# Patient Record
Sex: Male | Born: 1960 | Race: White | Hispanic: No | Marital: Married | Smoking: Former smoker
Health system: Southern US, Community
[De-identification: ages and names within clinical notes are randomized; demographics above are authoritative.]

## PROBLEM LIST (undated history)

## (undated) DIAGNOSIS — U071 COVID-19: Secondary | ICD-10-CM

## (undated) DIAGNOSIS — I059 Rheumatic mitral valve disease, unspecified: Secondary | ICD-10-CM

## (undated) DIAGNOSIS — E785 Hyperlipidemia, unspecified: Secondary | ICD-10-CM

## (undated) DIAGNOSIS — T148XXA Other injury of unspecified body region, initial encounter: Secondary | ICD-10-CM

## (undated) DIAGNOSIS — E119 Type 2 diabetes mellitus without complications: Secondary | ICD-10-CM

## (undated) DIAGNOSIS — M25512 Pain in left shoulder: Secondary | ICD-10-CM

## (undated) DIAGNOSIS — D126 Benign neoplasm of colon, unspecified: Secondary | ICD-10-CM

## (undated) DIAGNOSIS — M1990 Unspecified osteoarthritis, unspecified site: Secondary | ICD-10-CM

## (undated) DIAGNOSIS — F1011 Alcohol abuse, in remission: Secondary | ICD-10-CM

## (undated) DIAGNOSIS — R200 Anesthesia of skin: Secondary | ICD-10-CM

## (undated) DIAGNOSIS — E7800 Pure hypercholesterolemia, unspecified: Secondary | ICD-10-CM

## (undated) DIAGNOSIS — I700 Atherosclerosis of aorta: Secondary | ICD-10-CM

## (undated) DIAGNOSIS — K8020 Calculus of gallbladder without cholecystitis without obstruction: Secondary | ICD-10-CM

## (undated) DIAGNOSIS — K7460 Unspecified cirrhosis of liver: Secondary | ICD-10-CM

## (undated) DIAGNOSIS — I82409 Acute embolism and thrombosis of unspecified deep veins of unspecified lower extremity: Secondary | ICD-10-CM

## (undated) HISTORY — DX: Calculus of gallbladder without cholecystitis without obstruction: K80.20

## (undated) HISTORY — DX: Acute embolism and thrombosis of unspecified deep veins of unspecified lower extremity: I82.409

## (undated) HISTORY — DX: Alcohol abuse, in remission: F10.11

## (undated) HISTORY — PX: HAND SURGERY: SHX662

## (undated) HISTORY — DX: Hyperlipidemia, unspecified: E78.5

## (undated) HISTORY — DX: Pain in left shoulder: M25.512

## (undated) HISTORY — DX: Other injury of unspecified body region, initial encounter: T14.8XXA

## (undated) HISTORY — PX: LEG SURGERY: SHX1003

## (undated) HISTORY — DX: COVID-19: U07.1

## (undated) HISTORY — PX: EYE SURGERY: SHX253

## (undated) HISTORY — DX: Unspecified osteoarthritis, unspecified site: M19.90

## (undated) HISTORY — DX: Unspecified cirrhosis of liver: K74.60

## (undated) HISTORY — DX: Benign neoplasm of colon, unspecified: D12.6

## (undated) HISTORY — DX: Anesthesia of skin: R20.0

## (undated) HISTORY — DX: Rheumatic mitral valve disease, unspecified: I05.9

## (undated) HISTORY — DX: Atherosclerosis of aorta: I70.0

---

## 2015-01-04 ENCOUNTER — Emergency Department (HOSPITAL_COMMUNITY): Payer: Managed Care, Other (non HMO)

## 2015-01-04 ENCOUNTER — Encounter (HOSPITAL_COMMUNITY): Payer: Self-pay | Admitting: *Deleted

## 2015-01-04 ENCOUNTER — Emergency Department (HOSPITAL_COMMUNITY)
Admission: EM | Admit: 2015-01-04 | Discharge: 2015-01-04 | Disposition: A | Discharge status: Home / Self Care | Payer: Managed Care, Other (non HMO) | Attending: Emergency Medicine | Admitting: Emergency Medicine

## 2015-01-04 DIAGNOSIS — S0990XA Unspecified injury of head, initial encounter: Secondary | ICD-10-CM | POA: Insufficient documentation

## 2015-01-04 DIAGNOSIS — T1490XA Injury, unspecified, initial encounter: Secondary | ICD-10-CM

## 2015-01-04 DIAGNOSIS — S30811A Abrasion of abdominal wall, initial encounter: Secondary | ICD-10-CM | POA: Insufficient documentation

## 2015-01-04 DIAGNOSIS — S0285XB Fracture of orbit, unspecified, initial encounter for open fracture: Secondary | ICD-10-CM

## 2015-01-04 DIAGNOSIS — Y9289 Other specified places as the place of occurrence of the external cause: Secondary | ICD-10-CM | POA: Insufficient documentation

## 2015-01-04 DIAGNOSIS — W1789XA Other fall from one level to another, initial encounter: Secondary | ICD-10-CM | POA: Insufficient documentation

## 2015-01-04 DIAGNOSIS — S299XXA Unspecified injury of thorax, initial encounter: Secondary | ICD-10-CM | POA: Insufficient documentation

## 2015-01-04 DIAGNOSIS — S01112A Laceration without foreign body of left eyelid and periocular area, initial encounter: Secondary | ICD-10-CM | POA: Insufficient documentation

## 2015-01-04 DIAGNOSIS — Y9389 Activity, other specified: Secondary | ICD-10-CM | POA: Insufficient documentation

## 2015-01-04 DIAGNOSIS — Y998 Other external cause status: Secondary | ICD-10-CM | POA: Insufficient documentation

## 2015-01-04 DIAGNOSIS — S81812A Laceration without foreign body, left lower leg, initial encounter: Secondary | ICD-10-CM | POA: Diagnosis not present

## 2015-01-04 DIAGNOSIS — S0280XA Fracture of other specified skull and facial bones, unspecified side, initial encounter for closed fracture: Secondary | ICD-10-CM | POA: Insufficient documentation

## 2015-01-04 DIAGNOSIS — S0993XA Unspecified injury of face, initial encounter: Secondary | ICD-10-CM | POA: Diagnosis present

## 2015-01-04 DIAGNOSIS — Z79899 Other long term (current) drug therapy: Secondary | ICD-10-CM | POA: Diagnosis not present

## 2015-01-04 DIAGNOSIS — Z23 Encounter for immunization: Secondary | ICD-10-CM | POA: Insufficient documentation

## 2015-01-04 DIAGNOSIS — S0181XA Laceration without foreign body of other part of head, initial encounter: Secondary | ICD-10-CM

## 2015-01-04 DIAGNOSIS — E7800 Pure hypercholesterolemia, unspecified: Secondary | ICD-10-CM | POA: Insufficient documentation

## 2015-01-04 HISTORY — DX: Pure hypercholesterolemia, unspecified: E78.00

## 2015-01-04 LAB — COMPREHENSIVE METABOLIC PANEL
ALBUMIN: 3.6 g/dL (ref 3.5–5.0)
ALK PHOS: 120 U/L (ref 38–126)
ALT: 144 U/L — AB (ref 17–63)
AST: 157 U/L — AB (ref 15–41)
Anion gap: 10 (ref 5–15)
BUN: 18 mg/dL (ref 6–20)
CALCIUM: 8.8 mg/dL — AB (ref 8.9–10.3)
CHLORIDE: 100 mmol/L — AB (ref 101–111)
CO2: 27 mmol/L (ref 22–32)
CREATININE: 1.02 mg/dL (ref 0.61–1.24)
GFR calc Af Amer: >60 mL/min (ref >60)
GFR calc non Af Amer: >60 mL/min (ref >60)
GLUCOSE: 141 mg/dL — AB (ref 65–99)
Potassium: 3.7 mmol/L (ref 3.5–5.1)
SODIUM: 137 mmol/L (ref 135–145)
Total Bilirubin: 0.5 mg/dL (ref 0.3–1.2)
Total Protein: 6.8 g/dL (ref 6.5–8.1)

## 2015-01-04 LAB — CBC WITH DIFFERENTIAL/PLATELET
BASOS ABS: 0 10*3/uL (ref 0.0–0.1)
Basophils Relative: 0 %
EOS PCT: 2 %
Eosinophils Absolute: 0.1 10*3/uL (ref 0.0–0.7)
HCT: 46.4 % (ref 39.0–52.0)
Hemoglobin: 15.9 g/dL (ref 13.0–17.0)
LYMPHS PCT: 20 %
Lymphs Abs: 1.9 10*3/uL (ref 0.7–4.0)
MCH: 33.5 pg (ref 26.0–34.0)
MCHC: 34.3 g/dL (ref 30.0–36.0)
MCV: 97.9 fL (ref 78.0–100.0)
MONO ABS: 0.6 10*3/uL (ref 0.1–1.0)
MONOS PCT: 6 %
Neutro Abs: 7 10*3/uL (ref 1.7–7.7)
Neutrophils Relative %: 72 %
PLATELETS: 151 10*3/uL (ref 150–400)
RBC: 4.74 MIL/uL (ref 4.22–5.81)
RDW: 13 % (ref 11.5–15.5)
WBC: 9.6 10*3/uL (ref 4.0–10.5)

## 2015-01-04 LAB — I-STAT CHEM 8, ED
BUN: 22 mg/dL — AB (ref 6–20)
Calcium, Ion: 1.1 mmol/L — ABNORMAL LOW (ref 1.12–1.23)
Chloride: 101 mmol/L (ref 101–111)
Creatinine, Ser: 1 mg/dL (ref 0.61–1.24)
GLUCOSE: 138 mg/dL — AB (ref 65–99)
HCT: 47 % (ref 39.0–52.0)
HEMOGLOBIN: 16 g/dL (ref 13.0–17.0)
POTASSIUM: 3.7 mmol/L (ref 3.5–5.1)
Sodium: 143 mmol/L (ref 135–145)
TCO2: 27 mmol/L (ref 0–100)

## 2015-01-04 LAB — PROTIME-INR
INR: 1.17 (ref 0.00–1.49)
Prothrombin Time: 15 seconds (ref 11.6–15.2)

## 2015-01-04 LAB — TYPE AND SCREEN
ABO/RH(D): O POS
Antibody Screen: POSITIVE
DAT, IgG: NEGATIVE
PT AG Type: NEGATIVE

## 2015-01-04 MED ORDER — CEFAZOLIN SODIUM 1-5 GM-% IV SOLN
1.0000 g | Freq: Once | INTRAVENOUS | Status: AC
  Administered 2015-01-04: 1 g via INTRAVENOUS
  Filled 2015-01-04: qty 50

## 2015-01-04 MED ORDER — IOHEXOL 300 MG/ML  SOLN
100.0000 mL | Freq: Once | INTRAMUSCULAR | Status: AC | PRN
  Administered 2015-01-04: 100 mL via INTRAVENOUS

## 2015-01-04 MED ORDER — ERYTHROMYCIN 5 MG/GM OP OINT
1.0000 "application " | TOPICAL_OINTMENT | Freq: Once | OPHTHALMIC | Status: AC
  Administered 2015-01-04: 1 via OPHTHALMIC
  Filled 2015-01-04: qty 3.5

## 2015-01-04 MED ORDER — BACITRACIN ZINC 500 UNIT/GM EX OINT: 1.0000 "application " | TOPICAL_OINTMENT | Freq: Two times a day (BID) | CUTANEOUS | Status: DC

## 2015-01-04 MED ORDER — TETANUS-DIPHTH-ACELL PERTUSSIS 5-2.5-18.5 LF-MCG/0.5 IM SUSP
0.5000 mL | Freq: Once | INTRAMUSCULAR | Status: AC
  Administered 2015-01-04: 0.5 mL via INTRAMUSCULAR
  Filled 2015-01-04: qty 0.5

## 2015-01-04 MED ORDER — LIDOCAINE-EPINEPHRINE 1 %-1:100000 IJ SOLN
10.0000 mL | Freq: Once | INTRAMUSCULAR | Status: AC
  Administered 2015-01-04: 1 mL
  Filled 2015-01-04: qty 1

## 2015-01-04 MED ORDER — HYDROCODONE-ACETAMINOPHEN 5-325 MG PO TABS: 1.0000 | ORAL_TABLET | Freq: Four times a day (QID) | ORAL | Status: DC | PRN

## 2015-01-04 MED ORDER — CEPHALEXIN 500 MG PO CAPS: 500.0000 mg | ORAL_CAPSULE | Freq: Four times a day (QID) | ORAL | Status: DC

## 2015-01-04 MED ORDER — SODIUM CHLORIDE 0.9 % IV SOLN
Freq: Once | INTRAVENOUS | Status: AC
  Administered 2015-01-04: 15:00:00 via INTRAVENOUS

## 2015-01-04 MED ORDER — SODIUM CHLORIDE 0.9 % IV SOLN
INTRAVENOUS | Status: AC | PRN
  Administered 2015-01-04: 1000 mL via INTRAVENOUS

## 2015-01-04 MED ORDER — FENTANYL CITRATE (PF) 100 MCG/2ML IJ SOLN
100.0000 ug | Freq: Once | INTRAMUSCULAR | Status: AC
  Administered 2015-01-04: 100 ug via INTRAVENOUS
  Filled 2015-01-04: qty 2

## 2015-01-04 MED ORDER — ERYTHROMYCIN 5 MG/GM OP OINT: 1.0000 "application " | TOPICAL_OINTMENT | Freq: Once | OPHTHALMIC | Status: DC

## 2015-01-04 MED ORDER — FENTANYL CITRATE (PF) 100 MCG/2ML IJ SOLN
50.0000 ug | Freq: Once | INTRAMUSCULAR | Status: AC
  Administered 2015-01-04: 50 ug via INTRAVENOUS
  Filled 2015-01-04: qty 2

## 2015-01-04 NOTE — Progress Notes (Signed)
IS 1300-1500. Good cooperation. No complications noted. RN aware/bedside.

## 2015-01-04 NOTE — ED Notes (Signed)
Dr. Alfonse Spruce aware of pt.s low BP, Pt. Just received fentanyl

## 2015-01-04 NOTE — ED Provider Notes (Signed)
CSN: [id number]     Arrival date & time 01/04/15  1328 History   First MD Initiated Contact with Patient 01/04/15 1337     Chief Complaint  Patient presents with  . Trauma     (Consider location/radiation/quality/duration/timing/severity/associated sxs/prior Treatment) HPI Comments: 54 y.o. Male presents as a trauma.  The patient was about 12-15 feet off of the ground in a cherry picker/elevated stand when he fell.  The patient denies loosing consciousness but reports pain in his chest ever since the fall.  The patient denies being on blood thinners.  He states that he did land face down on the ground when he fell.     Past Medical History  Diagnosis Date  . High cholesterol    History reviewed. No pertinent past surgical history. History reviewed. No pertinent family history. Social History  Substance Use Topics  . Smoking status: Unknown If Ever Smoked  . Smokeless tobacco: None  . Alcohol Use: None    Review of Systems  Constitutional: Negative for fever, chills, appetite change and fatigue.  HENT: Negative for congestion, nosebleeds and sore throat.   Eyes: Positive for pain. Negative for photophobia and visual disturbance.  Respiratory: Negative for cough, chest tightness, shortness of breath and wheezing.   Cardiovascular: Positive for chest pain.  Gastrointestinal: Negative for nausea, vomiting, abdominal pain, diarrhea and constipation.  Genitourinary: Negative for dysuria, urgency and hematuria.  Musculoskeletal: Negative for myalgias and back pain.  Skin: Negative for rash.  Neurological: Negative for dizziness, seizures, syncope, speech difficulty, weakness and headaches.  Hematological: Does not bruise/bleed easily.      Allergies  Review of patient's allergies indicates no known allergies.  Home Medications   Prior to Admission medications   Medication Sig Start Date End Date Taking? Authorizing Provider  atorvastatin (LIPITOR) 40 MG tablet Take 40 mg by  mouth daily.   Yes Historical Provider, MD   BP 99/64 mmHg  Pulse 88  Temp(Src) 97.7 F (36.5 C) (Oral)  Resp 14  SpO2 100% Physical Exam  Constitutional: He is oriented to person, place, and time. No distress.  HENT:  Head: Normocephalic. Head is with contusion (significant of the upper eyelid) and with laceration (two lacerations around the left eye at sites depicted in the diagram).    Right Ear: Tympanic membrane normal. No hemotympanum.  Left Ear: Tympanic membrane normal. No hemotympanum.  Nose: No nasal septal hematoma. No epistaxis.  Mouth/Throat: Mucous membranes are not dry. No lacerations. No oropharyngeal exudate or posterior oropharyngeal edema.  Eyes: Pupils are equal, round, and reactive to light. Right eye exhibits no chemosis and no exudate. Left eye exhibits chemosis. Left eye exhibits no discharge and no exudate. No foreign body present in the left eye. Left conjunctiva is injected (with chemosis). Right eye exhibits normal extraocular motion and no nystagmus. Left eye exhibits normal extraocular motion and no nystagmus.  Fundoscopic exam:      The right eye shows no hemorrhage.       The left eye shows no hemorrhage.  Slit lamp exam:      The right eye shows no hyphema.       The left eye shows no hyphema.  Significant swelling of the eyelids of the left eye.  When upper lid retracted pupil round and reactive.  Full visual fields to confrontation in both eyes.  Full EOM in the left eye without pain or difficulty.  Patient able to count fingers when left eye held open but visual acuity  limited secondary to lid swelling  Neck: No spinous process tenderness and no muscular tenderness present.  Pulmonary/Chest: No respiratory distress. He has wheezes (few scattered bilaterally). He has no rales. He exhibits tenderness (diffuse chest wall tenderness). He exhibits no crepitus, no edema and no retraction.  Abdominal: Soft. He exhibits no distension. There is no tenderness.    Abrasion over the anterior upper abdominal wall  Musculoskeletal: Normal range of motion. He exhibits no edema or tenderness.  Neurological: He is alert and oriented to person, place, and time. He has normal strength. No cranial nerve deficit or sensory deficit. He exhibits normal muscle tone.  Patient able to ambulate to the bathroom without difficulty  Skin: Laceration (laceration over the left shin at the site depicted in the diagram) noted. He is not diaphoretic.          ED Course  Procedures (including critical care time) LACERATION REPAIR Performed by: Earlie Server Authorized by: Earlie Server Consent: Verbal consent obtained. Risks and benefits: risks, benefits and alternatives were discussed Consent given by: patient Patient identity confirmed: provided demographic data Prepped and Draped in normal sterile fashion Wound explored  Laceration Location: face around left eye, left shin  Laceration Length: Total: 4 cm  No Foreign Bodies seen or palpated  Anesthesia: local infiltration  Local anesthetic: lidocaine 1% with epinephrine  Anesthetic total: 10 ml  Irrigation method: syringe Amount of cleaning: standard  Skin closure: eyelid laceration repairs with subcutaneous and simple interrupted, left shin laceration closed with simple interrupted and horizontal and vertical mattress sutures  Number of sutures: 8 subcutaneous, 15 simple, 3 mattress sutures  Technique: subcutaneous, simple interrupted, and mattress sutures  Patient tolerance: Patient tolerated the procedure well with no immediate complications.  Labs Review Labs Reviewed  COMPREHENSIVE METABOLIC PANEL - Abnormal; Notable for the following:    Chloride 100 (*)    Glucose, Bld 141 (*)    Calcium 8.8 (*)    AST 157 (*)    ALT 144 (*)    All other components within normal limits  I-STAT CHEM 8, ED - Abnormal; Notable for the following:    BUN 22 (*)    Glucose, Bld 138 (*)    Calcium, Ion  1.10 (*)    All other components within normal limits  CBC WITH DIFFERENTIAL/PLATELET  PROTIME-INR  URINALYSIS, ROUTINE W REFLEX MICROSCOPIC (NOT AT [facility])  TYPE AND SCREEN    Imaging Review Dg Tibia/fibula Left  01/04/2015  CLINICAL DATA:  Pt was in cherry picker and the tree he was cutting down fell and knocked him down, landing on his face. He has sharp left sided chest pain. Ne pelvis complaints. And a deep penetrated laceration on the lateral side of his calf EXAM: LEFT TIBIA AND FIBULA - 2 VIEW COMPARISON:  None. FINDINGS: There is no evidence of fracture or other focal bone lesions. The knee and lateral malleolus are incompletely visualized on the lateral projection. Subcutaneous gas noted at the anterolateral mid calf level. No radiodense foreign body. IMPRESSION: Negative. Electronically Signed   By: Lucrezia Europe M.D.   On: 01/04/2015 14:12   Ct Head Wo Contrast  01/04/2015  CLINICAL DATA:  Multiple trauma to the head and face after being knocked from an elevated bucket by a tree. EXAM: CT HEAD WITHOUT CONTRAST CT MAXILLOFACIAL WITHOUT CONTRAST CT CERVICAL SPINE WITHOUT CONTRAST TECHNIQUE: Multidetector CT imaging of the head, cervical spine, and maxillofacial structures were performed using the standard protocol without intravenous contrast.  Multiplanar CT image reconstructions of the cervical spine and maxillofacial structures were also generated. COMPARISON:  None. FINDINGS: CT HEAD FINDINGS There is no acute intracranial hemorrhage, acute infarction, or intracranial mass lesion. Brain parenchyma is normal. Ventricles are normal. There is an air-fluid level in the left maxillary sinus and there is hemorrhage in the left orbit including intraconal and extraconal. CT MAXILLOFACIAL FINDINGS There is a blowout fracture of the floor of the left orbit. This does not involve the inferior orbital rim. There is a small air-fluid level in the left maxillary sinus. There is hemorrhage around the left by  a including intra and extraconal. There is no muscle entrapment in the fracture. The other walls of the orbit are intact. CT CERVICAL SPINE FINDINGS There is no fracture, subluxation, disc space narrowing, facet arthritis, or prevertebral soft tissue swelling. IMPRESSION: 1. Blowout fracture of the floor of the left orbit without involvement of the inferior orbital rim. There is intraconal and extraconal hemorrhage in the left orbit. 2. Normal appearing brain. 3. Normal appearing cervical spine. Electronically Signed   By: Lorriane Shire M.D.   On: 01/04/2015 14:58   Ct Chest W Contrast  01/04/2015  CLINICAL DATA:  Fall 10-12 feet, chest pain EXAM: CT CHEST, ABDOMEN, AND PELVIS WITH CONTRAST TECHNIQUE: Multidetector CT imaging of the chest, abdomen and pelvis was performed following the standard protocol during bolus administration of intravenous contrast. CONTRAST:  100 mL Omnipaque 300 IV COMPARISON:  None. FINDINGS: CT CHEST FINDINGS Mediastinum/Nodes: Heart is normal in size. No pericardial effusion. No suspicious mediastinal, hilar, or axillary lymphadenopathy. Visualized thyroid is unremarkable. Lungs/Pleura: Evaluation lung parenchyma is constrained by respiratory motion. Lungs are essentially clear. No suspicious pulmonary nodules. No focal consolidation. No pleural effusion or pneumothorax. Musculoskeletal: Degenerative changes of the thoracic spine. No fracture is seen. CT ABDOMEN PELVIS FINDINGS Hepatobiliary: Liver is notable for a nodular hepatic contour. No suspicious/enhancing hepatic lesions. Layering small gallstones (series 3/image 55). No associated inflammatory changes. No intrahepatic or extrahepatic ductal dilatation. Pancreas: Within normal limits. Spleen: Within normal limits. Adrenals/Urinary Tract: Adrenal glands are within normal limits. 7 mm posterior left lower pole renal cyst (series 3/ image 81). Kidneys are otherwise within normal limits. No hydronephrosis. Bladder is within  normal limits. Stomach/Bowel: Stomach is within normal limits. No evidence of bowel obstruction. Normal appendix (series 3/ image 98). Vascular/Lymphatic: Atherosclerotic calcifications of the abdominal aorta and branch vessels. No suspicious abdominopelvic lymphadenopathy. Reproductive: Prostate is unremarkable. Other: No abdominopelvic ascites. No hemoperitoneum or free air. Musculoskeletal: Mild superior endplate Schmorl's node deformity at L3 (sagittal image 78), chronic. Degenerative changes of the lumbar spine. No fracture is seen. IMPRESSION: No evidence of traumatic injury to the chest, abdomen, or pelvis. Nodular hepatic contour, raising the possibility of cirrhosis. Electronically Signed   By: Julian Hy M.D.   On: 01/04/2015 15:02   Ct Cervical Spine Wo Contrast  01/04/2015  CLINICAL DATA:  Multiple trauma to the head and face after being knocked from an elevated bucket by a tree. EXAM: CT HEAD WITHOUT CONTRAST CT MAXILLOFACIAL WITHOUT CONTRAST CT CERVICAL SPINE WITHOUT CONTRAST TECHNIQUE: Multidetector CT imaging of the head, cervical spine, and maxillofacial structures were performed using the standard protocol without intravenous contrast. Multiplanar CT image reconstructions of the cervical spine and maxillofacial structures were also generated. COMPARISON:  None. FINDINGS: CT HEAD FINDINGS There is no acute intracranial hemorrhage, acute infarction, or intracranial mass lesion. Brain parenchyma is normal. Ventricles are normal. There is an air-fluid level in the  left maxillary sinus and there is hemorrhage in the left orbit including intraconal and extraconal. CT MAXILLOFACIAL FINDINGS There is a blowout fracture of the floor of the left orbit. This does not involve the inferior orbital rim. There is a small air-fluid level in the left maxillary sinus. There is hemorrhage around the left by a including intra and extraconal. There is no muscle entrapment in the fracture. The other walls of  the orbit are intact. CT CERVICAL SPINE FINDINGS There is no fracture, subluxation, disc space narrowing, facet arthritis, or prevertebral soft tissue swelling. IMPRESSION: 1. Blowout fracture of the floor of the left orbit without involvement of the inferior orbital rim. There is intraconal and extraconal hemorrhage in the left orbit. 2. Normal appearing brain. 3. Normal appearing cervical spine. Electronically Signed   By: Lorriane Shire M.D.   On: 01/04/2015 14:58   Ct Abdomen Pelvis W Contrast  01/04/2015  CLINICAL DATA:  Fall 10-12 feet, chest pain EXAM: CT CHEST, ABDOMEN, AND PELVIS WITH CONTRAST TECHNIQUE: Multidetector CT imaging of the chest, abdomen and pelvis was performed following the standard protocol during bolus administration of intravenous contrast. CONTRAST:  100 mL Omnipaque 300 IV COMPARISON:  None. FINDINGS: CT CHEST FINDINGS Mediastinum/Nodes: Heart is normal in size. No pericardial effusion. No suspicious mediastinal, hilar, or axillary lymphadenopathy. Visualized thyroid is unremarkable. Lungs/Pleura: Evaluation lung parenchyma is constrained by respiratory motion. Lungs are essentially clear. No suspicious pulmonary nodules. No focal consolidation. No pleural effusion or pneumothorax. Musculoskeletal: Degenerative changes of the thoracic spine. No fracture is seen. CT ABDOMEN PELVIS FINDINGS Hepatobiliary: Liver is notable for a nodular hepatic contour. No suspicious/enhancing hepatic lesions. Layering small gallstones (series 3/image 55). No associated inflammatory changes. No intrahepatic or extrahepatic ductal dilatation. Pancreas: Within normal limits. Spleen: Within normal limits. Adrenals/Urinary Tract: Adrenal glands are within normal limits. 7 mm posterior left lower pole renal cyst (series 3/ image 81). Kidneys are otherwise within normal limits. No hydronephrosis. Bladder is within normal limits. Stomach/Bowel: Stomach is within normal limits. No evidence of bowel obstruction.  Normal appendix (series 3/ image 98). Vascular/Lymphatic: Atherosclerotic calcifications of the abdominal aorta and branch vessels. No suspicious abdominopelvic lymphadenopathy. Reproductive: Prostate is unremarkable. Other: No abdominopelvic ascites. No hemoperitoneum or free air. Musculoskeletal: Mild superior endplate Schmorl's node deformity at L3 (sagittal image 78), chronic. Degenerative changes of the lumbar spine. No fracture is seen. IMPRESSION: No evidence of traumatic injury to the chest, abdomen, or pelvis. Nodular hepatic contour, raising the possibility of cirrhosis. Electronically Signed   By: Julian Hy M.D.   On: 01/04/2015 15:02   Dg Pelvis Portable  01/04/2015  CLINICAL DATA:  Pt was in cherry picker and the tree he was cutting down fell and knocked him down, landing on his face. He has sharp left sided chest pain. Ne pelvis complaints. And a deep penetrated laceration on the lateral side of his calf * EXAM: PORTABLE PELVIS 1-2 VIEWS COMPARISON:  None. FINDINGS: There is no evidence of pelvic fracture or diastasis. No pelvic bone lesions are seen. IMPRESSION: Negative. Electronically Signed   By: Lucrezia Europe M.D.   On: 01/04/2015 14:11   Dg Chest Portable 1 View  01/04/2015  CLINICAL DATA:  Pt was in cherry picker and the tree he was cutting down fell and knocked him down, landing on his face. He has sharp left sided chest pain. Ne pelvis complaints. And a deep penetrated laceration on the lateral side of his calf EXAM: PORTABLE CHEST - 1 VIEW COMPARISON:  None available FINDINGS: Lungs are clear. Heart size and mediastinal contours are within normal limits. No effusion.  No pneumothorax. Visualized skeletal structures are unremarkable. IMPRESSION: No acute cardiopulmonary disease. Electronically Signed   By: Lucrezia Europe M.D.   On: 01/04/2015 14:10   Ct Maxillofacial Wo Cm  01/04/2015  CLINICAL DATA:  Multiple trauma to the head and face after being knocked from an elevated bucket  by a tree. EXAM: CT HEAD WITHOUT CONTRAST CT MAXILLOFACIAL WITHOUT CONTRAST CT CERVICAL SPINE WITHOUT CONTRAST TECHNIQUE: Multidetector CT imaging of the head, cervical spine, and maxillofacial structures were performed using the standard protocol without intravenous contrast. Multiplanar CT image reconstructions of the cervical spine and maxillofacial structures were also generated. COMPARISON:  None. FINDINGS: CT HEAD FINDINGS There is no acute intracranial hemorrhage, acute infarction, or intracranial mass lesion. Brain parenchyma is normal. Ventricles are normal. There is an air-fluid level in the left maxillary sinus and there is hemorrhage in the left orbit including intraconal and extraconal. CT MAXILLOFACIAL FINDINGS There is a blowout fracture of the floor of the left orbit. This does not involve the inferior orbital rim. There is a small air-fluid level in the left maxillary sinus. There is hemorrhage around the left by a including intra and extraconal. There is no muscle entrapment in the fracture. The other walls of the orbit are intact. CT CERVICAL SPINE FINDINGS There is no fracture, subluxation, disc space narrowing, facet arthritis, or prevertebral soft tissue swelling. IMPRESSION: 1. Blowout fracture of the floor of the left orbit without involvement of the inferior orbital rim. There is intraconal and extraconal hemorrhage in the left orbit. 2. Normal appearing brain. 3. Normal appearing cervical spine. Electronically Signed   By: Lorriane Shire M.D.   On: 01/04/2015 14:58   I have personally reviewed and evaluated these images and lab results as part of my medical decision-making.   EKG Interpretation None      MDM  Patent was seen and evaluated immediately in the trauma bay.  CT imaging with blow out fracture of the left orbit but no sign of globe injury and no sign of globe injury on examination and no signs of entrapment on CT or on physical examination.  CT negative for other acute  process.  C collar removed.  Lacerations around the eye discussed with Dr. Prudencio Burly who reviewed images of the lacerations and gave suggestions on repair but said they could be closed in the ER by myself.  Patient was given Kefzol for antibiotic coverage.  Lacerations closed as detailed above.  Patient given boostrix.  Fentanyl given for pain control.  Patient drank water without issue.  Ambulated to the bathroom well.  Patient given Erythromycin ointment for his left eye and Keflex.  He was also provided an incentive spirometer and Norco for pain control.  He was given strict return precautions and instruction to follow up Monday with ophtho and his PCP next week.  He had lots of support at home from family and friends.  He was discharged home in stable condition.  All questions answered prior to discharge. Final diagnoses:  None    1. Multiple lacerations  2. Facial injury  3. Orbital fracture    Harvel Quale, MD 01/05/15 [phone]

## 2015-01-04 NOTE — ED Notes (Signed)
Pt arrived by [facility], pt was in elevated bucket, had a tree fall down that knocked him down 10-76ft, he landed face first on ground. No loc. Has swelling to left eye, left side rib pain and diminished lung sounds on left side per ems. Abrasions noted to lower legs.

## 2015-01-04 NOTE — Discharge Instructions (Signed)
You were seen today after your fall.  You should rest at home.  Use the incentive spirometer to keep your lungs open and to keep from developing pneumonia.  Follow up with your primary care physician before returning to work for clearance.  Follow up with the eye doctor as directed.  Use the eye ointment provided 3-4 times a day.  Take the oral antibiotics and pain medication as prescribed.  Orbital Floor Fracture, Blowout The orbit, also called the eye socket, is a bony structure that protects the eye. The bottom wall of the orbit is called the orbital floor. It separates the orbit from a sinus. An orbital floor fracture is a break in the orbital floor. This type of fracture is called a "blowout" when tissues around the eye, including the muscle that is used to make the eye look down, becomes trapped within the fracture. CAUSES  An orbital floor fracture is caused by a direct blow (blunt trauma) to the eye from the front. SIGNS AND SYMPTOMS   A black eye.  Swelling and bruising around the eye.  A gurgling sound when pressure is placed on the eye area.  Seeing two of everything, with one object appearing higher than the other (vertical diplopia). The vertical diplopia is worse when looking up.  Pain around the eye when looking up.  One eye looks sunken compared to the other eye.  Numbness of the cheek and upper gum on the same side of the face as was injured. DIAGNOSIS  A diagnosis is made with an eye exam. It is confirmed with X-rays or a CT scan of the eye. TREATMENT  You may be prescribed medicines such as antibiotics, steroids, or decongestants. The fracture itself is usually not treated until all the swelling around the eye has gone away. This may take 1-2 weeks. After the swelling has gone away:  If your eye is not trapped within the fracture, you will not need treatment.  If you have persistent vertical double vision, your health care provider may try to free the muscle. If he or  she cannot, you may need to have surgery.  If you have double vision, but only when looking up, your health care provider will discuss treatment options with you. Some people who do not spend a lot of time looking up choose not to have additional treatment. Others who need to look up often, such as electricians, need treatment. HOME CARE INSTRUCTIONS  Keep all follow-up visits as directed by your health care provider. This is important.  Take medicines only as directed by your health care provider.  Follow your health care provider's instructions about:  Using ice packs or cold compresses to decrease swelling.  Sleeping with your head elevated.  Always follow recommendations about the wearing of protective glasses or goggles.  Do not wear contact lenses until your health care provider says it is okay.  Do not drive or perform your regular activities without your health care provider's approval. Be aware that if you are only using one eye to see, you may have difficulty with depth perception and the ability to judge distance.  Do not blow your nose.  Stay away from dusty areas.  Avoid traveling by plane or going to high-altitude areas. This may slow the healing of your swelling and increase sinus pain. SEEK MEDICAL CARE IF:  Your vision changes.  The redness or swelling around the injured eye does not go away or becomes worse.  Blood or discolored discharge comes  from your nose.  You have a fever. SEEK IMMEDIATE MEDICAL CARE IF:   You have a sensation that you are seeing flashing lights.  You have sudden blindness. MAKE SURE YOU:  Understand these instructions.  Will watch your condition.  Will get help right away if you are not doing well or get worse.   This information is not intended to replace advice given to you by your health care provider. Make sure you discuss any questions you have with your health care provider.   Document Released: 08/18/2000 Document  Revised: 03/15/2014 Document Reviewed: 04/26/2013 Elsevier Interactive Patient Education 2016 [employer].  Chest Contusion A chest contusion is a deep bruise on your chest area. Contusions are the result of an injury that caused bleeding under the skin. A chest contusion may involve bruising of the skin, muscles, or ribs. The contusion may turn blue, purple, or yellow. Minor injuries will give you a painless contusion, but more severe contusions may stay painful and swollen for a few weeks. CAUSES  A contusion is usually caused by a blow, trauma, or direct force to an area of the body. SYMPTOMS   Swelling and redness of the injured area.  Discoloration of the injured area.  Tenderness and soreness of the injured area.  Pain. DIAGNOSIS  The diagnosis can be made by taking a history and performing a physical exam. An X-ray, CT scan, or MRI may be needed to determine if there were any associated injuries, such as broken bones (fractures) or internal injuries. TREATMENT  Often, the best treatment for a chest contusion is resting, icing, and applying cold compresses to the injured area. Deep breathing exercises may be recommended to reduce the risk of pneumonia. Over-the-counter medicines may also be recommended for pain control. HOME CARE INSTRUCTIONS   Put ice on the injured area.  Put ice in a plastic bag.  Place a towel between your skin and the bag.  Leave the ice on for 15-20 minutes, 03-04 times a day.  Only take over-the-counter or prescription medicines as directed by your caregiver. Your caregiver may recommend avoiding anti-inflammatory medicines (aspirin, ibuprofen, and naproxen) for 48 hours because these medicines may increase bruising.  Rest the injured area.  Perform deep-breathing exercises as directed by your caregiver.  Stop smoking if you smoke.  Do not lift objects over 5 pounds (2.3 kg) for 3 days or longer if recommended by your caregiver. SEEK IMMEDIATE  MEDICAL CARE IF:   You have increased bruising or swelling.  You have pain that is getting worse.  You have difficulty breathing.  You have dizziness, weakness, or fainting.  You have blood in your urine or stool.  You cough up or vomit blood.  Your swelling or pain is not relieved with medicines. MAKE SURE YOU:   Understand these instructions.  Will watch your condition.  Will get help right away if you are not doing well or get worse.   This information is not intended to replace advice given to you by your health care provider. Make sure you discuss any questions you have with your health care provider.   Document Released: 11/17/2000 Document Revised: 11/17/2011 Document Reviewed: 08/16/2011 Elsevier Interactive Patient Education 2016 [place] Injury, Adult You have received a head injury. It does not appear serious at this time. Headaches and vomiting are common following head injury. It should be easy to awaken from sleeping. Sometimes it is necessary for you to stay in the emergency department for  a while for observation. Sometimes admission to the hospital may be needed. After injuries such as yours, most problems occur within the first 24 hours, but side effects may occur up to 7-10 days after the injury. It is important for you to carefully monitor your condition and contact your health care provider or seek immediate medical care if there is a change in your condition. WHAT ARE THE TYPES OF HEAD INJURIES? Head injuries can be as minor as a bump. Some head injuries can be more severe. More severe head injuries include:  A jarring injury to the brain (concussion).  A bruise of the brain (contusion). This mean there is bleeding in the brain that can cause swelling.  A cracked skull (skull fracture).  Bleeding in the brain that collects, clots, and forms a bump (hematoma). WHAT CAUSES A HEAD INJURY? A serious head injury is most likely to happen to someone  who is in a car wreck and is not wearing a seat belt. Other causes of major head injuries include bicycle or motorcycle accidents, sports injuries, and falls. HOW ARE HEAD INJURIES DIAGNOSED? A complete history of the event leading to the injury and your current symptoms will be helpful in diagnosing head injuries. Many times, pictures of the brain, such as CT or MRI are needed to see the extent of the injury. Often, an overnight hospital stay is necessary for observation.  WHEN SHOULD I SEEK IMMEDIATE MEDICAL CARE?  You should get help right away if:  You have confusion or drowsiness.  You feel sick to your stomach (nauseous) or have continued, forceful vomiting.  You have dizziness or unsteadiness that is getting worse.  You have severe, continued headaches not relieved by medicine. Only take over-the-counter or prescription medicines for pain, fever, or discomfort as directed by your health care provider.  You do not have normal function of the arms or legs or are unable to walk.  You notice changes in the black spots in the center of the colored part of your eye (pupil).  You have a clear or bloody fluid coming from your nose or ears.  You have a loss of vision. During the next 24 hours after the injury, you must stay with someone who can watch you for the warning signs. This person should contact local emergency services (911 in the [place] if you have seizures, you become unconscious, or you are unable to wake up. HOW CAN I PREVENT A HEAD INJURY IN THE FUTURE? The most important factor for preventing major head injuries is avoiding motor vehicle accidents. To minimize the potential for damage to your head, it is crucial to wear seat belts while riding in motor vehicles. Wearing helmets while bike riding and playing collision sports (like football) is also helpful. Also, avoiding dangerous activities around the house will further help reduce your risk of head injury.  WHEN CAN I RETURN TO  NORMAL ACTIVITIES AND ATHLETICS? You should be reevaluated by your health care provider before returning to these activities. If you have any of the following symptoms, you should not return to activities or contact sports until 1 week after the symptoms have stopped:  Persistent headache.  Dizziness or vertigo.  Poor attention and concentration.  Confusion.  Memory problems.  Nausea or vomiting.  Fatigue or tire easily.  Irritability.  Intolerant of bright lights or loud noises.  Anxiety or depression.  Disturbed sleep. MAKE SURE YOU:   Understand these instructions.  Will watch your condition.  Will get  help right away if you are not doing well or get worse.   This information is not intended to replace advice given to you by your health care provider. Make sure you discuss any questions you have with your health care provider.   Document Released: 02/22/2005 Document Revised: 03/15/2014 Document Reviewed: 10/30/2012 Elsevier Interactive Patient Education [employer].

## 2015-08-22 ENCOUNTER — Encounter: Payer: Self-pay | Admitting: Gastroenterology

## 2015-09-30 ENCOUNTER — Ambulatory Visit (AMBULATORY_SURGERY_CENTER): Payer: Self-pay

## 2015-09-30 VITALS — Ht 74.0 in | Wt 296.4 lb

## 2015-09-30 DIAGNOSIS — Z1211 Encounter for screening for malignant neoplasm of colon: Secondary | ICD-10-CM

## 2015-09-30 MED ORDER — NA SULFATE-K SULFATE-MG SULF 17.5-3.13-1.6 GM/177ML PO SOLN: ORAL | 0 refills | Status: DC

## 2015-09-30 NOTE — Progress Notes (Signed)
Per pt, no allergies to soy or egg products.Pt not taking any weight loss meds or using  O2 at home. 

## 2015-10-14 ENCOUNTER — Encounter: Payer: Self-pay | Admitting: Gastroenterology

## 2015-10-14 ENCOUNTER — Ambulatory Visit (AMBULATORY_SURGERY_CENTER): Payer: Managed Care, Other (non HMO) | Admitting: Gastroenterology

## 2015-10-14 VITALS — BP 148/74 | HR 71 | Temp 97.5°F | Resp 18 | Ht 74.0 in | Wt 296.0 lb

## 2015-10-14 DIAGNOSIS — Z1211 Encounter for screening for malignant neoplasm of colon: Secondary | ICD-10-CM | POA: Diagnosis present

## 2015-10-14 DIAGNOSIS — K635 Polyp of colon: Secondary | ICD-10-CM | POA: Diagnosis not present

## 2015-10-14 DIAGNOSIS — D126 Benign neoplasm of colon, unspecified: Secondary | ICD-10-CM

## 2015-10-14 DIAGNOSIS — D124 Benign neoplasm of descending colon: Secondary | ICD-10-CM

## 2015-10-14 DIAGNOSIS — D120 Benign neoplasm of cecum: Secondary | ICD-10-CM

## 2015-10-14 MED ORDER — SODIUM CHLORIDE 0.9 % IV SOLN: 500.0000 mL | INTRAVENOUS | Status: DC

## 2015-10-14 NOTE — Op Note (Signed)
[facility] Patient Name: Travis Hampton , MD Age: 55 Referring MD:  Date of Birth: 01-24-1961 Gender: Male Account #: [id number] Procedure:                Colonoscopy Indications:              Screening for malignant neoplasm in the colon, This                            is the patient's first colonoscopy Medicines:                Monitored Anesthesia Care Procedure:                Pre-Anesthesia Assessment:                           - Prior to the procedure, a History and Physical                            was performed, and patient medications and                            allergies were reviewed. The patient's tolerance of                            previous anesthesia was also reviewed. The risks                            and benefits of the procedure and the sedation                            options and risks were discussed with the patient.                            All questions were answered, and informed consent                            was obtained. Prior Anticoagulants: The patient has                            taken no previous anticoagulant or antiplatelet                            agents. ASA Grade Assessment: II - A patient with                            mild systemic disease. After reviewing the risks                            and benefits, the patient was deemed in                            satisfactory condition to undergo the procedure.  After obtaining informed consent, the colonoscope                            was passed under direct vision. Throughout the                            procedure, the patient's blood pressure, pulse, and                            oxygen saturations were monitored continuously. The                            Model CF-HQ190L [id number]) scope was introduced                            through the anus  and advanced to the the cecum,                            identified by appendiceal orifice and ileocecal                            valve. The Model PCF-H190DL [id number]) scope was                            introduced through the anus and advanced to the the                            cecum, identified by appendiceal orifice and                            ileocecal valve. The colonoscopy was technically                            difficult and complex due to restricted mobility of                            the colon. The patient tolerated the procedure                            well. The quality of the bowel preparation was                            adequate. The ileocecal valve, appendiceal orifice,                            and rectum were photographed. Scope In: 11:39:33 AM Scope Out: 12:11:58 PM Scope Withdrawal Time: 0 hours 16 minutes 31 seconds  Total Procedure Duration: 0 hours 32 minutes 25 seconds  Findings:                 The perianal and digital rectal examinations were                            normal.  A 4 mm polyp was found in the cecum. The polyp was                            sessile. The polyp was removed with a cold snare.                            Resection and retrieval were complete.                           Three sessile polyps were found in the descending                            colon. The polyps were 4 to 7 mm in size. These                            polyps were removed with a cold snare. Resection                            and retrieval were complete.                           The exam was otherwise without abnormality. The                            patient has left shoulder pain and could not lie on                            his left side. We began the procedure flat on his                            back with regular colonoscope. There was                            significant restricted mobility of the left colon                             and the regular colonscope would not traverse the                            sigmoid colon. The patient was placed on his right                            side and a pediatric colonoscope was used to                            traverse the left colon and complete the exam. Complications:            No immediate complications. Estimated blood loss:                            Minimal. Estimated Blood Loss:     Estimated blood loss was minimal. Impression:               -  One 4 mm polyp in the cecum, removed with a cold                            snare. Resected and retrieved.                           - Three 4 to 7 mm polyps in the descending colon,                            removed with a cold snare. Resected and retrieved.                           - Restricted mobility of the left colon, the exam                            required the use of a pediatric colonoscope to                            traverse this area.                           - The examination was otherwise normal. Recommendation:           - Patient has a contact number available for                            emergencies. The signs and symptoms of potential                            delayed complications were discussed with the                            patient. Return to normal activities tomorrow.                            Written discharge instructions were provided to the                            patient.                           - Resume previous diet.                           - Continue present medications.                           - No aspirin, ibuprofen, naproxen, or other                            non-steroidal anti-inflammatory drugs for 2 weeks                            after polyp removal.                           -  Await pathology results.                           - Repeat colonoscopy is recommended for                            surveillance. The colonoscopy date will  be                            determined after pathology results from today's                            exam become available for review. Remo Lipps P. Marris Frontera, MD 10/14/2015 12:18:52 PM This report has been signed electronically.
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
## 2015-10-14 NOTE — Patient Instructions (Signed)
YOU HAD AN ENDOSCOPIC PROCEDURE TODAY AT [facility]:   Refer to the procedure report that was given to you for any specific questions about what was found during the examination.  If the procedure report does not answer your questions, please call your gastroenterologist to clarify.  If you requested that your care partner not be given the details of your procedure findings, then the procedure report has been included in a sealed envelope for you to review at your convenience later.  YOU SHOULD EXPECT: Some feelings of bloating in the abdomen. Passage of more gas than usual.  Walking can help get rid of the air that was put into your GI tract during the procedure and reduce the bloating. If you had a lower endoscopy (such as a colonoscopy or flexible sigmoidoscopy) you may notice spotting of blood in your stool or on the toilet paper. If you underwent a bowel prep for your procedure, you may not have a normal bowel movement for a few days.  Please Note:  You might notice some irritation and congestion in your nose or some drainage.  This is from the oxygen used during your procedure.  There is no need for concern and it should clear up in a day or so.  SYMPTOMS TO REPORT IMMEDIATELY:   Following lower endoscopy (colonoscopy or flexible sigmoidoscopy):  Excessive amounts of blood in the stool  Significant tenderness or worsening of abdominal pains  Swelling of the abdomen that is new, acute  Fever of 100F or higher  For urgent or emergent issues, a gastroenterologist can be reached at any hour by calling [phone].   DIET: Your first meal following the procedure should be a small meal and then it is ok to progress to your normal diet. Heavy or fried foods are harder to digest and may make you feel nauseous or bloated.  Likewise, meals heavy in dairy and vegetables can increase bloating.  Drink plenty of fluids but you should avoid alcoholic beverages for 24  hours.  ACTIVITY:  You should plan to take it easy for the rest of today and you should NOT DRIVE or use heavy machinery until tomorrow (because of the sedation medicines used during the test).    FOLLOW UP: Our staff will call the number listed on your records the next business day following your procedure to check on you and address any questions or concerns that you may have regarding the information given to you following your procedure. If we do not reach you, we will leave a message.  However, if you are feeling well and you are not experiencing any problems, there is no need to return our call.  We will assume that you have returned to your regular daily activities without incident.  If any biopsies were taken you will be contacted by phone or by letter within the next 1-3 weeks.  Please call us at [phone] if you have not heard about the biopsies in 3 weeks.    SIGNATURES/CONFIDENTIALITY: You and/or your care partner have signed paperwork which will be entered into your electronic medical record.  These signatures attest to the fact that that the information above on your After Visit Summary has been reviewed and is understood.  Full responsibility of the confidentiality of this discharge information lies with you and/or your care-partner.  No Aspirin, Aspirin containing products (BC or Goody powders) or NSAIDS (Advil, Aleve, Motrin, Ibuprofen) for 2 weeks, Tylenol is ok to take  Please  read over handout about polyps  Continue your normal medications

## 2015-10-14 NOTE — Progress Notes (Signed)
To recovery, report to [facility], RN, VSS

## 2015-10-14 NOTE — Progress Notes (Signed)
Called to room to assist during endoscopic procedure.  Patient ID and intended procedure confirmed with present staff. Received instructions for my participation in the procedure from the performing physician.  

## 2015-10-15 ENCOUNTER — Telehealth: Payer: Self-pay | Admitting: *Deleted

## 2015-10-15 NOTE — Telephone Encounter (Signed)
Attempted to call x3 busy,no message left.

## 2015-10-21 ENCOUNTER — Encounter: Payer: Self-pay | Admitting: Gastroenterology

## 2016-03-26 DIAGNOSIS — R509 Fever, unspecified: Secondary | ICD-10-CM | POA: Diagnosis not present

## 2016-03-26 DIAGNOSIS — R05 Cough: Secondary | ICD-10-CM | POA: Diagnosis not present

## 2016-08-18 DIAGNOSIS — Z0000 Encounter for general adult medical examination without abnormal findings: Secondary | ICD-10-CM | POA: Diagnosis not present

## 2016-08-26 DIAGNOSIS — Z0000 Encounter for general adult medical examination without abnormal findings: Secondary | ICD-10-CM | POA: Diagnosis not present
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
## 2017-02-24 DIAGNOSIS — E119 Type 2 diabetes mellitus without complications: Secondary | ICD-10-CM | POA: Diagnosis not present

## 2017-02-24 DIAGNOSIS — E7800 Pure hypercholesterolemia, unspecified: Secondary | ICD-10-CM | POA: Diagnosis not present

## 2018-10-02 ENCOUNTER — Encounter: Payer: Self-pay | Admitting: Gastroenterology

## 2020-05-29 ENCOUNTER — Emergency Department (HOSPITAL_BASED_OUTPATIENT_CLINIC_OR_DEPARTMENT_OTHER)
Admission: EM | Admit: 2020-05-29 | Discharge: 2020-05-29 | Disposition: A | Discharge status: Home / Self Care | Payer: 59 | Source: Home / Self Care | Attending: Emergency Medicine | Admitting: Emergency Medicine

## 2020-05-29 ENCOUNTER — Emergency Department (HOSPITAL_BASED_OUTPATIENT_CLINIC_OR_DEPARTMENT_OTHER): Payer: 59

## 2020-05-29 ENCOUNTER — Other Ambulatory Visit: Payer: Self-pay

## 2020-05-29 ENCOUNTER — Encounter (HOSPITAL_BASED_OUTPATIENT_CLINIC_OR_DEPARTMENT_OTHER): Payer: Self-pay | Admitting: Emergency Medicine

## 2020-05-29 DIAGNOSIS — M79605 Pain in left leg: Secondary | ICD-10-CM

## 2020-05-29 DIAGNOSIS — Z79899 Other long term (current) drug therapy: Secondary | ICD-10-CM | POA: Insufficient documentation

## 2020-05-29 DIAGNOSIS — R911 Solitary pulmonary nodule: Secondary | ICD-10-CM | POA: Insufficient documentation

## 2020-05-29 DIAGNOSIS — M25562 Pain in left knee: Secondary | ICD-10-CM | POA: Insufficient documentation

## 2020-05-29 DIAGNOSIS — Z87891 Personal history of nicotine dependence: Secondary | ICD-10-CM | POA: Insufficient documentation

## 2020-05-29 DIAGNOSIS — E119 Type 2 diabetes mellitus without complications: Secondary | ICD-10-CM | POA: Insufficient documentation

## 2020-05-29 DIAGNOSIS — R079 Chest pain, unspecified: Secondary | ICD-10-CM | POA: Insufficient documentation

## 2020-05-29 LAB — I-STAT VENOUS BLOOD GAS, ED
Acid-Base Excess: 6 mmol/L — ABNORMAL HIGH (ref 0.0–2.0)
Bicarbonate: 31.9 mmol/L — ABNORMAL HIGH (ref 20.0–28.0)
Calcium, Ion: 1.15 mmol/L (ref 1.15–1.40)
HCT: 36 % — ABNORMAL LOW (ref 39.0–52.0)
Hemoglobin: 12.2 g/dL — ABNORMAL LOW (ref 13.0–17.0)
O2 Saturation: 40 %
Potassium: 3.1 mmol/L — ABNORMAL LOW (ref 3.5–5.1)
Sodium: 134 mmol/L — ABNORMAL LOW (ref 135–145)
TCO2: 33 mmol/L — ABNORMAL HIGH (ref 22–32)
pCO2, Ven: 48.3 mmHg (ref 44.0–60.0)
pH, Ven: 7.427 (ref 7.250–7.430)
pO2, Ven: 23 mmHg — CL (ref 32.0–45.0)

## 2020-05-29 LAB — CBC
HCT: 37.8 % — ABNORMAL LOW (ref 39.0–52.0)
Hemoglobin: 13.4 g/dL (ref 13.0–17.0)
MCH: 34.9 pg — ABNORMAL HIGH (ref 26.0–34.0)
MCHC: 35.4 g/dL (ref 30.0–36.0)
MCV: 98.4 fL (ref 80.0–100.0)
Platelets: 120 10*3/uL — ABNORMAL LOW (ref 150–400)
RBC: 3.84 MIL/uL — ABNORMAL LOW (ref 4.22–5.81)
RDW: 12.5 % (ref 11.5–15.5)
WBC: 8.9 10*3/uL (ref 4.0–10.5)
nRBC: 0 % (ref 0.0–0.2)

## 2020-05-29 LAB — BASIC METABOLIC PANEL
Anion gap: 14 (ref 5–15)
BUN: 13 mg/dL (ref 8–23)
CO2: 27 mmol/L (ref 22–32)
Calcium: 8.7 mg/dL — ABNORMAL LOW (ref 8.9–10.3)
Chloride: 89 mmol/L — ABNORMAL LOW (ref 98–111)
Creatinine, Ser: 1.05 mg/dL (ref 0.61–1.24)
GFR, Estimated: >60 mL/min (ref >60)
Glucose, Bld: 578 mg/dL (ref 70–99)
Potassium: 3.2 mmol/L — ABNORMAL LOW (ref 3.5–5.1)
Sodium: 130 mmol/L — ABNORMAL LOW (ref 135–145)

## 2020-05-29 LAB — CBG MONITORING, ED
Glucose-Capillary: 450 mg/dL — ABNORMAL HIGH (ref 70–99)
Glucose-Capillary: 386 mg/dL — ABNORMAL HIGH (ref 70–99)
Glucose-Capillary: 402 mg/dL — ABNORMAL HIGH (ref 70–99)
Glucose-Capillary: 326 mg/dL — ABNORMAL HIGH (ref 70–99)

## 2020-05-29 LAB — TROPONIN I (HIGH SENSITIVITY)
Troponin I (High Sensitivity): 8 ng/L (ref <18)
Troponin I (High Sensitivity): 9 ng/L (ref <18)

## 2020-05-29 LAB — BETA-HYDROXYBUTYRIC ACID: Beta-Hydroxybutyric Acid: 0.15 mmol/L (ref 0.05–0.27)

## 2020-05-29 MED ORDER — INSULIN ASPART 100 UNIT/ML ~~LOC~~ SOLN
8.0000 [IU] | Freq: Once | SUBCUTANEOUS | Status: AC
  Administered 2020-05-29: 8 [IU] via SUBCUTANEOUS

## 2020-05-29 MED ORDER — IOHEXOL 350 MG/ML SOLN
100.0000 mL | Freq: Once | INTRAVENOUS | Status: AC | PRN
  Administered 2020-05-29: 100 mL via INTRAVENOUS

## 2020-05-29 MED ORDER — POTASSIUM CHLORIDE CRYS ER 20 MEQ PO TBCR
40.0000 meq | EXTENDED_RELEASE_TABLET | Freq: Once | ORAL | Status: AC
  Administered 2020-05-29: 40 meq via ORAL
  Filled 2020-05-29: qty 2

## 2020-05-29 MED ORDER — METFORMIN HCL 500 MG PO TABS: 500.0000 mg | ORAL_TABLET | Freq: Two times a day (BID) | ORAL | 0 refills | Status: DC

## 2020-05-29 MED ORDER — SODIUM CHLORIDE 0.9 % IV BOLUS
1000.0000 mL | Freq: Once | INTRAVENOUS | Status: AC
  Administered 2020-05-29: 1000 mL via INTRAVENOUS

## 2020-05-29 MED ORDER — INSULIN ASPART 100 UNIT/ML IV SOLN
10.0000 [IU] | Freq: Once | INTRAVENOUS | Status: AC
  Administered 2020-05-29: 10 [IU] via INTRAVENOUS

## 2020-05-29 NOTE — ED Provider Notes (Signed)
[facility] EMERGENCY DEPARTMENT Provider Note   CSN: [id number] Arrival date & time: 05/29/20  1246     History Chief Complaint  Patient presents with  . Chest Pain  . Leg Pain    Travis Hampton is a 60 y.o. male who presents to the ED today with complaint of chest pain and leg pain.  Reports that yesterday he woke up with some centralized chest pain that felt like a pressure.  He is not sure how long it lasted.  This is when he noticed some pain behind his left knee as well.  He states that he could not move his knee very well due to the pain.  He reports that the pain in his left knee is worsened with any movement.  He also feels short of breath with ambulation however he assumed this was from the knee pain.  The chest pain resolved sometime yesterday and he has not had any today.  He does mention that he has coughed up blood 3 different times in the last month with the last episode being Sunday.  He denies any history of DVT/PE.  He denies any recent prolonged travel.  He did travel approximately 3 hours to [place] about 1 month ago however did not think much of it.  He denies any active malignancy.  No exogenous hormone use.  Denies fevers or chills.  Denies any history of CAD or family history.  Patient is a former smoker, quit approximately 5 years ago however had been smoking since the age of 29.   The history is provided by the patient and medical records.       Past Medical History:  Diagnosis Date  . Arthritis   . High cholesterol   . Numbness    left side face due to tree limb accident, after accident October 2016  . Shoulder pain, left    left shoulder pain  . Torn ligament    left shoulder- found out September 05, 2015    There are no problems to display for this patient.   Past Surgical History:  Procedure Laterality Date  . EYE SURGERY     limb fell on cherry picker and pt was thrown out/12/2014  . LEG SURGERY     due to injury from cherry picker in  01/04/15       Family History  Problem Relation Age of Onset  . Colon cancer Cousin   . Esophageal cancer Neg Hx   . Rectal cancer Neg Hx   . Stomach cancer Neg Hx     Social History   Tobacco Use  . Smoking status: Former Smoker    Packs/day: 0.50    Types: Cigarettes  . Smokeless tobacco: Former Systems developer    Quit date: 09/29/1972  . Tobacco comment: quit July 2017  Substance Use Topics  . Alcohol use: No  . Drug use: No    Home Medications Prior to Admission medications   Medication Sig Start Date End Date Taking? Authorizing Provider  metFORMIN (GLUCOPHAGE) 500 MG tablet Take 1 tablet (500 mg total) by mouth 2 (two) times daily with a meal. 05/29/20 06/28/20 Yes Yvaine Jankowiak, PA-C  Multiple Vitamin (MULTIVITAMIN) tablet Take 1 tablet by mouth daily. Centrum MVI-Take one daily   Yes [provider]  atorvastatin (LIPITOR) 40 MG tablet Take 20 mg by mouth daily.     [provider]    Allergies    Patient has no known allergies.  Review of  Systems   Review of Systems  Constitutional: Negative for chills and fever.  Respiratory: Positive for shortness of breath.        + hemoptysis  Cardiovascular: Positive for chest pain.  Endocrine: Positive for polydipsia and polyuria.  Musculoskeletal: Positive for arthralgias.  All other systems reviewed and are negative.   Physical Exam Updated Vital Signs BP 131/65 (BP Location: Left Arm)   Pulse 97   Resp 18   Ht 6\' 2"  (1.88 m)   Wt 115.7 kg   SpO2 95%   BMI 32.74 kg/m   Physical Exam Vitals and nursing note reviewed.  Constitutional:      Appearance: He is obese. He is not ill-appearing or diaphoretic.  HENT:     Head: Normocephalic and atraumatic.  Eyes:     Conjunctiva/sclera: Conjunctivae normal.  Cardiovascular:     Rate and Rhythm: Normal rate and regular rhythm.     Pulses:          Radial pulses are 2+ on the right side and 2+ on the left side.       Dorsalis pedis pulses are 2+ on  the right side and 2+ on the left side.     Heart sounds: Normal heart sounds.  Pulmonary:     Effort: Pulmonary effort is normal.     Breath sounds: Normal breath sounds. No decreased breath sounds, wheezing, rhonchi or rales.  Chest:     Chest wall: No tenderness.  Abdominal:     Palpations: Abdomen is soft.     Tenderness: There is no abdominal tenderness. There is no guarding or rebound.  Musculoskeletal:     Cervical back: Neck supple.     Right lower leg: No edema.     Left lower leg: Tenderness present. No edema.     Comments: + left popliteal TTP. ROM limited to the knee s/2 pain. Mild swelling noted. No erythema or increased warmth to the touch of the knee. 2+ DP pulse.   Skin:    General: Skin is warm and dry.  Neurological:     Mental Status: He is alert.     ED Results / Procedures / Treatments   Labs (all labs ordered are listed, but only abnormal results are displayed) Labs Reviewed  BASIC METABOLIC PANEL - Abnormal; Notable for the following components:      Result Value   Sodium 130 (*)    Potassium 3.2 (*)    Chloride 89 (*)    Glucose, Bld 578 (*)    Calcium 8.7 (*)    All other components within normal limits  CBC - Abnormal; Notable for the following components:   RBC 3.84 (*)    HCT 37.8 (*)    MCH 34.9 (*)    Platelets 120 (*)    All other components within normal limits  CBG MONITORING, ED - Abnormal; Notable for the following components:   Glucose-Capillary 450 (*)    All other components within normal limits  I-STAT VENOUS BLOOD GAS, ED - Abnormal; Notable for the following components:   pO2, Ven 23.0 (*)    Bicarbonate 31.9 (*)    TCO2 33 (*)    Acid-Base Excess 6.0 (*)    Sodium 134 (*)    Potassium 3.1 (*)    HCT 36.0 (*)    Hemoglobin 12.2 (*)    All other components within normal limits  CBG MONITORING, ED - Abnormal; Notable for the following components:   Glucose-Capillary 386 (*)  All other components within normal limits  CBG  MONITORING, ED - Abnormal; Notable for the following components:   Glucose-Capillary 402 (*)    All other components within normal limits  CBG MONITORING, ED - Abnormal; Notable for the following components:   Glucose-Capillary 326 (*)    All other components within normal limits  BLOOD GAS, VENOUS  BETA-HYDROXYBUTYRIC ACID  TROPONIN I (HIGH SENSITIVITY)  TROPONIN I (HIGH SENSITIVITY)    EKG EKG Interpretation  Date/Time:  Thursday May 29 2020 12:57:19 EDT Ventricular Rate:  105 PR Interval:  172 QRS Duration: 100 QT Interval:  378 QTC Calculation: 499 R Axis:   15 Text Interpretation: Sinus tachycardia Otherwise normal ECG Interpretation limited secondary to artifact No previous ECGs available Confirmed by Fredia Sorrow [phone]) on 05/29/2020 2:18:17 PM   Radiology DG Chest 2 View  Result Date: 05/29/2020 CLINICAL DATA:  Chest pain and shortness of breath beginning yesterday. Intermittent hemoptysis. EXAM: CHEST - 2 VIEW COMPARISON:  01/04/2015 FINDINGS: The heart size and mediastinal contours are within normal limits. Both lungs are clear. The visualized skeletal structures are unremarkable. IMPRESSION: No active cardiopulmonary disease. Electronically Signed   By: Marlaine Hind M.D.   On: 05/29/2020 13:48   CT Angio Chest PE W/Cm &/Or Wo Cm  Result Date: 05/29/2020 CLINICAL DATA:  Chest pain. EXAM: CT ANGIOGRAPHY CHEST WITH CONTRAST TECHNIQUE: Multidetector CT imaging of the chest was performed using the standard protocol during bolus administration of intravenous contrast. Multiplanar CT image reconstructions and MIPs were obtained to evaluate the vascular anatomy. CONTRAST:  161mL OMNIPAQUE IOHEXOL 350 MG/ML SOLN COMPARISON:  January 04, 2015. FINDINGS: Cardiovascular: Satisfactory opacification of the pulmonary arteries to the segmental level. No evidence of pulmonary embolism. Normal heart size. No pericardial effusion. Mediastinum/Nodes: No enlarged mediastinal, hilar, or  axillary lymph nodes. Thyroid gland, trachea, and esophagus demonstrate no significant findings. Lungs/Pleura: No pneumothorax or pleural effusion is noted. 5 mm nodule is noted in left lower lobe best seen on image number 53 of series 5. 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 Mild splenomegaly is noted. Musculoskeletal: No chest wall abnormality. No acute or significant osseous findings. Review of the MIP images confirms the above findings. IMPRESSION: 1. No definite evidence of pulmonary embolus. 2. 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 Mild splenomegaly is noted. 3. 5 mm left lower lobe nodule is noted. No follow-up needed if patient is low-risk. Non-contrast chest CT can be considered in 12 months if patient is high-risk. This recommendation follows the consensus statement: Guidelines for Management of Incidental Pulmonary Nodules Detected on CT Images: From the Fleischner Society 2017; Radiology 2017; 284:228-243. Electronically Signed   By: Marijo Conception M.D.   On: 05/29/2020 15:52   US Venous Img Lower Unilateral Left  Result Date: 05/29/2020 CLINICAL DATA:  Left leg pain EXAM: LEFT LOWER EXTREMITY VENOUS DOPPLER ULTRASOUND TECHNIQUE: Gray-scale sonography with graded compression, as well as color Doppler and duplex ultrasound were performed to evaluate the lower extremity deep venous systems from the level of the common femoral vein and including the common femoral, femoral, profunda femoral, popliteal and calf veins including the posterior tibial, peroneal and gastrocnemius veins when visible. The superficial great saphenous vein was also interrogated. Spectral Doppler was utilized to evaluate flow at rest and with distal augmentation maneuvers in the common femoral, femoral and popliteal veins. COMPARISON:  None. FINDINGS: Contralateral Common Femoral Vein: Respiratory phasicity is normal and symmetric with the symptomatic side. No evidence of thrombus. Normal compressibility. Common Femoral  Vein: No evidence of thrombus. Normal compressibility, respiratory phasicity and response to augmentation. Saphenofemoral Junction: No evidence of thrombus. Normal compressibility and flow on color Doppler imaging. Profunda Femoral Vein: No evidence of thrombus. Normal compressibility and flow on color Doppler imaging. Femoral Vein: No evidence of thrombus. Normal compressibility, respiratory phasicity and response to augmentation. Popliteal Vein: No evidence of thrombus. Normal compressibility, respiratory phasicity and response to augmentation. Calf Veins: No evidence of thrombus. Normal compressibility and flow on color Doppler imaging. Superficial Great Saphenous Vein: No evidence of thrombus. Normal compressibility. Venous Reflux:  None. Other Findings:  None. IMPRESSION: No evidence of deep venous thrombosis. Electronically Signed   By: Inez Catalina M.D.   On: 05/29/2020 15:57    Procedures Procedures   Medications Ordered in ED Medications  sodium chloride 0.9 % bolus 1,000 mL (0 mLs Intravenous Stopped 05/29/20 1602)  insulin aspart (novoLOG) injection 10 Units (10 Units Intravenous Given 05/29/20 1449)  iohexol (OMNIPAQUE) 350 MG/ML injection 100 mL (100 mLs Intravenous Contrast Given 05/29/20 1512)  potassium chloride SA (KLOR-CON) CR tablet 40 mEq (40 mEq Oral Given 05/29/20 1605)  sodium chloride 0.9 % bolus 1,000 mL (0 mLs Intravenous Stopped 05/29/20 1827)  insulin aspart (novoLOG) injection 8 Units (8 Units Subcutaneous Given 05/29/20 1722)    ED Course  I have reviewed the triage vital signs and the nursing notes.  Pertinent labs & imaging results that were available during my care of the patient were reviewed by me and considered in my medical decision making (see chart for details).    MDM Rules/Calculators/A&P                          60 year old male who presents to the ED today with complaint of chest pain that occurred yesterday as well as left popliteal pain that has been  present since yesterday with 3 episodes of hemoptysis over the past month. No trauma to leg. On arrival to the ED vitals are stable.  Patient appears to be in no acute distress.  We were asked while he was in triage if a DVT study could be done which we agreed on however it has not been done at this time.  Lab work was obtained which does show a BMP with a glucose of 578.  No history of diabetes.  His bicarb is normal at 27 without a gap.  He does endorse increased thirst and increased urination recently however he cannot put a timeframe on it.  Patient will need to be worked up for DVT as well as PE at this time as well as new onset diabetes. Will provide fluids, 10 units IV insulin per attending physician Dr. Gloris Manchester recommendations, and obtain VBG and betahydroxybutryic acid.   VBG with normal PH 7.427 Troponin of 8 and then 9 CXR clear CTA negative DVT study negative. Do not feel pt requires additional imaging at this time given DVT study was negative without trauma to the leg.  Repeat CBG 386 > 402. Will provide additional 1 L fluid bolus and 8 units subcue insulin and reassess.   Repeat CBG 326. Will discharge pt home at this time. Will provide metformin for patient however he is instructed that he needs to follow up/establish care with PCP for further management. Diet changes discussed with pt. I have also recommended repeat DVT Study if pain persists > 2 weeks. He is encouraged to do RICE therapy for same. Pt is in agreement with plan  at this time and stable for discharge home   This note was prepared using Dragon voice recognition software and may include unintentional dictation errors due to the inherent limitations of voice recognition software.  Final Clinical Impression(s) / ED Diagnoses Final diagnoses:  New onset type 2 diabetes mellitus ([place])  Left leg pain  Nonspecific chest pain  Pulmonary nodule    Rx / DC Orders ED Discharge Orders         Ordered    metFORMIN  (GLUCOPHAGE) 500 MG tablet  2 times daily with meals        05/29/20 1836           Discharge Instructions     Please pick up medication and take as prescribed for your diabetes. You will need to establish care with a primary care physician within 1 month so that they can represcribe this medication for you and check your a1c level.   Your CT scan showed findings concerning for a pulmonary nodule on the left side. It is recommended that you have repeat imaging done in 1 year for follow up. Your PCP can schedule this for you.   Please rest, ice, and elevate your left leg to help reduce pain/swelling. I would recommend taking Ibuprofen for pain at this time. If your pain persists > 10-14 days it is recommended that you have a repeat ultrasound to definitively rule out a blood clot.   Return to the ED IMMEDIATELY for any worsening symptoms       Eustaquio Maize, PA-C 05/29/20 1841    Fredia Sorrow, MD 05/31/20 [phone]

## 2020-05-29 NOTE — ED Notes (Signed)
Able to teach back basic Type 2 DM education.  He states his wife has been a diabetic for 20 years. He plans to become re established with his primary care MD.

## 2020-05-29 NOTE — ED Triage Notes (Signed)
C/o left leg pain behind his knee that started yesterday.  Also reports having central cp described as pressure yesterday.  None today but reports he been coughing up blood three different times in the last month.

## 2020-05-29 NOTE — Discharge Instructions (Addendum)
Please pick up medication and take as prescribed for your diabetes. You will need to establish care with a primary care physician within 1 month so that they can represcribe this medication for you and check your a1c level.   Your CT scan showed findings concerning for a pulmonary nodule on the left side. It is recommended that you have repeat imaging done in 1 year for follow up. Your PCP can schedule this for you.   Please rest, ice, and elevate your left leg to help reduce pain/swelling. I would recommend taking Ibuprofen for pain at this time. If your pain persists > 10-14 days it is recommended that you have a repeat ultrasound to definitively rule out a blood clot.   Return to the ED IMMEDIATELY for any worsening symptoms

## 2020-06-01 ENCOUNTER — Inpatient Hospital Stay (HOSPITAL_COMMUNITY)
Admission: EM | Admit: 2020-06-01 | Discharge: 2020-06-07 | DRG: 177 | Disposition: A | Discharge status: Home Health | Payer: 59 | Attending: Internal Medicine | Admitting: Internal Medicine

## 2020-06-01 ENCOUNTER — Ambulatory Visit
Admission: EM | Admit: 2020-06-01 | Discharge: 2020-06-01 | Disposition: A | Discharge status: Home / Self Care | Payer: 59 | Attending: Physician Assistant | Admitting: Physician Assistant

## 2020-06-01 ENCOUNTER — Encounter (HOSPITAL_COMMUNITY): Payer: Self-pay | Admitting: Emergency Medicine

## 2020-06-01 ENCOUNTER — Ambulatory Visit (INDEPENDENT_AMBULATORY_CARE_PROVIDER_SITE_OTHER): Payer: 59

## 2020-06-01 ENCOUNTER — Emergency Department (HOSPITAL_COMMUNITY): Payer: 59

## 2020-06-01 ENCOUNTER — Other Ambulatory Visit: Payer: Self-pay

## 2020-06-01 DIAGNOSIS — E876 Hypokalemia: Secondary | ICD-10-CM | POA: Diagnosis present

## 2020-06-01 DIAGNOSIS — R911 Solitary pulmonary nodule: Secondary | ICD-10-CM | POA: Diagnosis present

## 2020-06-01 DIAGNOSIS — R7881 Bacteremia: Secondary | ICD-10-CM | POA: Diagnosis present

## 2020-06-01 DIAGNOSIS — R0602 Shortness of breath: Secondary | ICD-10-CM

## 2020-06-01 DIAGNOSIS — I330 Acute and subacute infective endocarditis: Secondary | ICD-10-CM | POA: Diagnosis present

## 2020-06-01 DIAGNOSIS — E872 Acidosis: Secondary | ICD-10-CM | POA: Diagnosis present

## 2020-06-01 DIAGNOSIS — Z7984 Long term (current) use of oral hypoglycemic drugs: Secondary | ICD-10-CM

## 2020-06-01 DIAGNOSIS — E785 Hyperlipidemia, unspecified: Secondary | ICD-10-CM | POA: Diagnosis present

## 2020-06-01 DIAGNOSIS — B9561 Methicillin susceptible Staphylococcus aureus infection as the cause of diseases classified elsewhere: Secondary | ICD-10-CM | POA: Diagnosis present

## 2020-06-01 DIAGNOSIS — U071 COVID-19: Secondary | ICD-10-CM | POA: Diagnosis present

## 2020-06-01 DIAGNOSIS — Z8616 Personal history of COVID-19: Secondary | ICD-10-CM

## 2020-06-01 DIAGNOSIS — Z87891 Personal history of nicotine dependence: Secondary | ICD-10-CM

## 2020-06-01 DIAGNOSIS — M25562 Pain in left knee: Secondary | ICD-10-CM | POA: Diagnosis present

## 2020-06-01 DIAGNOSIS — R0600 Dyspnea, unspecified: Secondary | ICD-10-CM | POA: Diagnosis present

## 2020-06-01 DIAGNOSIS — E1165 Type 2 diabetes mellitus with hyperglycemia: Secondary | ICD-10-CM | POA: Diagnosis present

## 2020-06-01 DIAGNOSIS — J1282 Pneumonia due to coronavirus disease 2019: Secondary | ICD-10-CM | POA: Diagnosis present

## 2020-06-01 DIAGNOSIS — K7460 Unspecified cirrhosis of liver: Secondary | ICD-10-CM | POA: Diagnosis present

## 2020-06-01 DIAGNOSIS — J9601 Acute respiratory failure with hypoxia: Secondary | ICD-10-CM | POA: Diagnosis present

## 2020-06-01 DIAGNOSIS — R059 Cough, unspecified: Secondary | ICD-10-CM

## 2020-06-01 DIAGNOSIS — M791 Myalgia, unspecified site: Secondary | ICD-10-CM | POA: Diagnosis not present

## 2020-06-01 DIAGNOSIS — Z28311 Partially vaccinated for covid-19: Secondary | ICD-10-CM | POA: Diagnosis present

## 2020-06-01 DIAGNOSIS — I340 Nonrheumatic mitral (valve) insufficiency: Secondary | ICD-10-CM | POA: Diagnosis not present

## 2020-06-01 DIAGNOSIS — R509 Fever, unspecified: Secondary | ICD-10-CM | POA: Diagnosis not present

## 2020-06-01 HISTORY — DX: Type 2 diabetes mellitus without complications: E11.9

## 2020-06-01 LAB — BASIC METABOLIC PANEL
Anion gap: 9 (ref 5–15)
BUN: 11 mg/dL (ref 8–23)
CO2: 29 mmol/L (ref 22–32)
Calcium: 8.2 mg/dL — ABNORMAL LOW (ref 8.9–10.3)
Chloride: 93 mmol/L — ABNORMAL LOW (ref 98–111)
Creatinine, Ser: 1 mg/dL (ref 0.61–1.24)
GFR, Estimated: >60 mL/min (ref >60)
Glucose, Bld: 383 mg/dL — ABNORMAL HIGH (ref 70–99)
Potassium: 3.3 mmol/L — ABNORMAL LOW (ref 3.5–5.1)
Sodium: 131 mmol/L — ABNORMAL LOW (ref 135–145)

## 2020-06-01 LAB — PROTIME-INR
INR: 1.5 — ABNORMAL HIGH (ref 0.8–1.2)
Prothrombin Time: 17.3 seconds — ABNORMAL HIGH (ref 11.4–15.2)

## 2020-06-01 LAB — GLUCOSE, CAPILLARY: Glucose-Capillary: 424 mg/dL — ABNORMAL HIGH (ref 70–99)

## 2020-06-01 LAB — D-DIMER, QUANTITATIVE: D-Dimer, Quant: 2.15 ug/mL-FEU — ABNORMAL HIGH (ref 0.00–0.50)

## 2020-06-01 LAB — CBC
HCT: 38.3 % — ABNORMAL LOW (ref 39.0–52.0)
Hemoglobin: 12.7 g/dL — ABNORMAL LOW (ref 13.0–17.0)
MCH: 33.9 pg (ref 26.0–34.0)
MCHC: 33.2 g/dL (ref 30.0–36.0)
MCV: 102.1 fL — ABNORMAL HIGH (ref 80.0–100.0)
Platelets: 154 10*3/uL (ref 150–400)
RBC: 3.75 MIL/uL — ABNORMAL LOW (ref 4.22–5.81)
RDW: 12.7 % (ref 11.5–15.5)
WBC: 10.2 10*3/uL (ref 4.0–10.5)
nRBC: 0 % (ref 0.0–0.2)

## 2020-06-01 LAB — HEPATIC FUNCTION PANEL
ALT: 26 U/L (ref 0–44)
AST: 39 U/L (ref 15–41)
Albumin: 2.6 g/dL — ABNORMAL LOW (ref 3.5–5.0)
Alkaline Phosphatase: 100 U/L (ref 38–126)
Bilirubin, Direct: 1 mg/dL — ABNORMAL HIGH (ref 0.0–0.2)
Indirect Bilirubin: 1.4 mg/dL — ABNORMAL HIGH (ref 0.3–0.9)
Total Bilirubin: 2.4 mg/dL — ABNORMAL HIGH (ref 0.3–1.2)
Total Protein: 6.8 g/dL (ref 6.5–8.1)

## 2020-06-01 LAB — LACTIC ACID, PLASMA
Lactic Acid, Venous: 2.1 mmol/L (ref 0.5–1.9)
Lactic Acid, Venous: 2.9 mmol/L (ref 0.5–1.9)

## 2020-06-01 LAB — RESP PANEL BY RT-PCR (FLU A&B, COVID) ARPGX2
Influenza A by PCR: NEGATIVE
Influenza B by PCR: NEGATIVE
SARS Coronavirus 2 by RT PCR: POSITIVE — AB

## 2020-06-01 LAB — BRAIN NATRIURETIC PEPTIDE
B Natriuretic Peptide: 190.2 pg/mL — ABNORMAL HIGH (ref 0.0–100.0)
B Natriuretic Peptide: 197.8 pg/mL — ABNORMAL HIGH (ref 0.0–100.0)

## 2020-06-01 LAB — TROPONIN I (HIGH SENSITIVITY)
Troponin I (High Sensitivity): 16 ng/L (ref <18)
Troponin I (High Sensitivity): 13 ng/L (ref <18)
Troponin I (High Sensitivity): 14 ng/L (ref <18)
Troponin I (High Sensitivity): 12 ng/L (ref <18)

## 2020-06-01 LAB — HIV ANTIBODY (ROUTINE TESTING W REFLEX): HIV Screen 4th Generation wRfx: NONREACTIVE

## 2020-06-01 LAB — FIBRINOGEN: Fibrinogen: 635 mg/dL — ABNORMAL HIGH (ref 210–475)

## 2020-06-01 LAB — HEPATITIS B SURFACE ANTIGEN: Hepatitis B Surface Ag: NONREACTIVE

## 2020-06-01 LAB — FERRITIN: Ferritin: 1871 ng/mL — ABNORMAL HIGH (ref 24–336)

## 2020-06-01 LAB — C-REACTIVE PROTEIN: CRP: 18.6 mg/dL — ABNORMAL HIGH (ref <1.0)

## 2020-06-01 LAB — LACTATE DEHYDROGENASE: LDH: 244 U/L — ABNORMAL HIGH (ref 98–192)

## 2020-06-01 LAB — PROCALCITONIN: Procalcitonin: 0.92 ng/mL

## 2020-06-01 LAB — ABO/RH: ABO/RH(D): O POS

## 2020-06-01 MED ORDER — SODIUM CHLORIDE 0.9 % IV SOLN
200.0000 mg | Freq: Once | INTRAVENOUS | Status: AC
  Administered 2020-06-01: 200 mg via INTRAVENOUS
  Filled 2020-06-01: qty 40

## 2020-06-01 MED ORDER — AZITHROMYCIN 500 MG IV SOLR
500.0000 mg | Freq: Once | INTRAVENOUS | Status: AC
  Administered 2020-06-01: 500 mg via INTRAVENOUS
  Filled 2020-06-01: qty 500

## 2020-06-01 MED ORDER — SENNOSIDES-DOCUSATE SODIUM 8.6-50 MG PO TABS
1.0000 | ORAL_TABLET | Freq: Every evening | ORAL | Status: DC | PRN
Start: 2020-06-01 — End: 2020-06-07

## 2020-06-01 MED ORDER — SODIUM CHLORIDE 0.9 % IV BOLUS: 500.0000 mL | Freq: Once | INTRAVENOUS | Status: DC

## 2020-06-01 MED ORDER — GUAIFENESIN-DM 100-10 MG/5ML PO SYRP
10.0000 mL | ORAL_SOLUTION | ORAL | Status: DC | PRN
  Administered 2020-06-01 – 2020-06-07 (×7): 10 mL via ORAL
  Filled 2020-06-01 (×8): qty 10

## 2020-06-01 MED ORDER — INSULIN ASPART 100 UNIT/ML ~~LOC~~ SOLN
0.0000 [IU] | Freq: Three times a day (TID) | SUBCUTANEOUS | Status: DC
  Administered 2020-06-02: 20 [IU] via SUBCUTANEOUS

## 2020-06-01 MED ORDER — ACETAMINOPHEN 325 MG PO TABS: 650.0000 mg | ORAL_TABLET | Freq: Four times a day (QID) | ORAL | Status: DC | PRN

## 2020-06-01 MED ORDER — ALBUTEROL SULFATE HFA 108 (90 BASE) MCG/ACT IN AERS
6.0000 | INHALATION_SPRAY | Freq: Once | RESPIRATORY_TRACT | Status: AC
  Administered 2020-06-01: 6 via RESPIRATORY_TRACT
  Filled 2020-06-01: qty 6.7

## 2020-06-01 MED ORDER — ATORVASTATIN CALCIUM 10 MG PO TABS
20.0000 mg | ORAL_TABLET | Freq: Every day | ORAL | Status: DC
  Administered 2020-06-01 – 2020-06-07 (×7): 20 mg via ORAL
  Filled 2020-06-01 (×7): qty 2

## 2020-06-01 MED ORDER — SODIUM CHLORIDE 0.9 % IV SOLN
1.0000 g | Freq: Once | INTRAVENOUS | Status: AC
  Administered 2020-06-01: 1 g via INTRAVENOUS
  Filled 2020-06-01: qty 10

## 2020-06-01 MED ORDER — PREDNISONE 5 MG PO TABS: 50.0000 mg | ORAL_TABLET | Freq: Every day | ORAL | Status: DC

## 2020-06-01 MED ORDER — SODIUM CHLORIDE 0.9 % IV SOLN
100.0000 mg | Freq: Every day | INTRAVENOUS | Status: AC
  Administered 2020-06-02 – 2020-06-05 (×4): 100 mg via INTRAVENOUS
  Filled 2020-06-01 (×5): qty 20

## 2020-06-01 MED ORDER — POTASSIUM CHLORIDE CRYS ER 20 MEQ PO TBCR
40.0000 meq | EXTENDED_RELEASE_TABLET | Freq: Once | ORAL | Status: AC
  Administered 2020-06-01: 40 meq via ORAL
  Filled 2020-06-01: qty 2

## 2020-06-01 MED ORDER — INSULIN ASPART 100 UNIT/ML ~~LOC~~ SOLN
8.0000 [IU] | Freq: Once | SUBCUTANEOUS | Status: AC
  Administered 2020-06-01: 8 [IU] via SUBCUTANEOUS

## 2020-06-01 MED ORDER — METFORMIN HCL 500 MG PO TABS
500.0000 mg | ORAL_TABLET | Freq: Two times a day (BID) | ORAL | Status: DC
  Administered 2020-06-02: 500 mg via ORAL
  Filled 2020-06-01: qty 1

## 2020-06-01 MED ORDER — IPRATROPIUM-ALBUTEROL 20-100 MCG/ACT IN AERS
1.0000 | INHALATION_SPRAY | Freq: Four times a day (QID) | RESPIRATORY_TRACT | Status: DC
  Administered 2020-06-01 – 2020-06-02 (×2): 1 via RESPIRATORY_TRACT
  Filled 2020-06-01: qty 4

## 2020-06-01 MED ORDER — AEROCHAMBER PLUS FLO-VU LARGE MISC
1.0000 | Freq: Once | Status: AC
  Administered 2020-06-01: 1

## 2020-06-01 MED ORDER — INSULIN ASPART 100 UNIT/ML ~~LOC~~ SOLN
4.0000 [IU] | Freq: Three times a day (TID) | SUBCUTANEOUS | Status: DC
  Administered 2020-06-02: 4 [IU] via SUBCUTANEOUS

## 2020-06-01 MED ORDER — METHYLPREDNISOLONE SODIUM SUCC 125 MG IJ SOLR
1.0000 mg/kg | Freq: Two times a day (BID) | INTRAMUSCULAR | Status: DC
  Administered 2020-06-01 – 2020-06-02 (×2): 115.625 mg via INTRAVENOUS
  Filled 2020-06-01 (×2): qty 2

## 2020-06-01 MED ORDER — ACETAMINOPHEN 325 MG PO TABS
650.0000 mg | ORAL_TABLET | Freq: Once | ORAL | Status: AC | PRN
  Administered 2020-06-01: 650 mg via ORAL
  Filled 2020-06-01: qty 2

## 2020-06-01 MED ORDER — ENOXAPARIN SODIUM 40 MG/0.4ML ~~LOC~~ SOLN
40.0000 mg | SUBCUTANEOUS | Status: DC
  Administered 2020-06-02 – 2020-06-07 (×6): 40 mg via SUBCUTANEOUS
  Filled 2020-06-01 (×6): qty 0.4

## 2020-06-01 MED ORDER — OXYCODONE HCL 5 MG PO TABS
5.0000 mg | ORAL_TABLET | ORAL | Status: DC | PRN
  Administered 2020-06-01: 5 mg via ORAL
  Filled 2020-06-01: qty 1

## 2020-06-01 NOTE — Discharge Instructions (Signed)
Your oxygen levels are low.  Go to the hospital for evaluation

## 2020-06-01 NOTE — ED Provider Notes (Signed)
Care assumed from C. Couture PA-C at shift change pending BNP, delta troponin, lactic acid, BNP, ambulation trial.  See her note for full H&P.   Briefly this is a 60 yo male with recent diabetes diagnosis presenting with cough, shortness of breath, fever and bilateral lower extremity edema. He was seen in the ED recently started on Metformin.  At that visit he also had a negative DVT study, negative CTA.  Work-up per previous provider shows no leukocytosis, hemoglobin consistent with baseline.  Mild hypokalemia 3.3, hyperglycemia with a glucose of 383 and normal anion gap, no renal insufficiency.  He was given potassium, albuterol and Tylenol.  There is concern for pneumonia given his fever with URI symptoms.  He was started on Rocephin and a Zithromax.  Chest x-ray resulted and did not show any signs of pneumonia, did comment on mild vascular congestion.  EKG shows sinus rhythm with prolonged QT 537.   PCP- [facility]  Physical Exam  BP (!) 110/54   Pulse 91   Temp (!) 101.4 F (38.6 C)   Resp (!) 21   SpO2 94%   Physical Exam PE: Constitutional: well-developed, well-nourished, no apparent distress HENT: normocephalic, atraumatic. no cervical adenopathy Cardiovascular: normal rate and rhythm, distal pulses intact Pulmonary/Chest: effort normal; breath sounds clear and equal bilaterally; no wheezes or rales Abdominal: soft and nontender Musculoskeletal: full ROM, lateral lower extremity edema. Neurological: alert with goal directed thinking Skin: warm and dry, no rash, no diaphoresis Psychiatric: normal mood and affect, normal behavior   ED Course/Procedures   Results for orders placed or performed during the hospital encounter of 06/01/20 (from the past 24 hour(s))  Basic metabolic panel     Status: Abnormal   Collection Time: 06/01/20  2:32 PM  Result Value Ref Range   Sodium 131 (L) 135 - 145 mmol/L   Potassium 3.3 (L) 3.5 - 5.1 mmol/L   Chloride 93 (L) 98 - 111 mmol/L    CO2 29 22 - 32 mmol/L   Glucose, Bld 383 (H) 70 - 99 mg/dL   BUN 11 8 - 23 mg/dL   Creatinine, Ser 1.00 0.61 - 1.24 mg/dL   Calcium 8.2 (L) 8.9 - 10.3 mg/dL   GFR, Estimated >60 >60 mL/min   Anion gap 9 5 - 15  CBC     Status: Abnormal   Collection Time: 06/01/20  2:32 PM  Result Value Ref Range   WBC 10.2 4.0 - 10.5 K/uL   RBC 3.75 (L) 4.22 - 5.81 MIL/uL   Hemoglobin 12.7 (L) 13.0 - 17.0 g/dL   HCT 38.3 (L) 39.0 - 52.0 %   MCV 102.1 (H) 80.0 - 100.0 fL   MCH 33.9 26.0 - 34.0 pg   MCHC 33.2 30.0 - 36.0 g/dL   RDW 12.7 11.5 - 15.5 %   Platelets 154 150 - 400 K/uL   nRBC 0.0 0.0 - 0.2 %  Troponin I (High Sensitivity)     Status: None   Collection Time: 06/01/20  2:32 PM  Result Value Ref Range   Troponin I (High Sensitivity) 16 <18 ng/L  Brain natriuretic peptide     Status: Abnormal   Collection Time: 06/01/20  2:32 PM  Result Value Ref Range   B Natriuretic Peptide 190.2 (H) 0.0 - 100.0 pg/mL  Resp Panel by RT-PCR (Flu A&B, Covid) Nasopharyngeal Swab     Status: Abnormal   Collection Time: 06/01/20  3:35 PM   Specimen: Nasopharyngeal Swab; Nasopharyngeal(NP) swabs in vial transport  medium  Result Value Ref Range   SARS Coronavirus 2 by RT PCR POSITIVE (A) NEGATIVE   Influenza A by PCR NEGATIVE NEGATIVE   Influenza B by PCR NEGATIVE NEGATIVE  Lactic acid, plasma     Status: Abnormal   Collection Time: 06/01/20  4:54 PM  Result Value Ref Range   Lactic Acid, Venous 2.1 (HH) 0.5 - 1.9 mmol/L  Troponin I (High Sensitivity)     Status: None   Collection Time: 06/01/20  5:12 PM  Result Value Ref Range   Troponin I (High Sensitivity) 13 <18 ng/L   CHEST - 2 VIEW    COMPARISON: 06/01/2020 prior studies    FINDINGS:  The cardiomediastinal silhouette is unremarkable.    Minimal pulmonary vascular congestion present.    There is no evidence of focal airspace disease, pulmonary edema,  suspicious pulmonary nodule/mass, pleural effusion, or pneumothorax.    No acute  bony abnormalities are identified.    IMPRESSION:  Minimal pulmonary vascular congestion.      Electronically Signed  By: Margarette Canada M.D.  On: 06/01/2020 14:59     MDM  Patient received in sign out. Please see pervious provider note to include MDM up to this point.   Lactic acidosis 2.1. BNP elevated at 190. Delta troponin still in process. Covid test is positive.  Patient ambulated with oxygen saturation maintaining 93-96% on room air. He admits to feeling extremely short of breath and was tachypneic.  It took him several minutes to be able to catch his breath.  When patient fell asleep oxygen titration on room air dropped into the 80s.  His blood pressures are soft.  Given his recent new diagnosis of diabetes, borderline hypotension and tachypnea feel that he would benefit from admission to the hospital.   Spoke with Dr. Roosevelt Locks with hospitalist service who agrees to assume care of patient and bring into the hospital for further evaluation and management.      Portions of this note were generated with [employer]. Dictation errors may occur despite best attempts at proofreading.    Barrie Folk, PA-C 06/01/20 1805    Daleen Bo, MD 06/01/20 [phone]

## 2020-06-01 NOTE — ED Notes (Signed)
Patient c/o sob. Bilateral leg swelling left worse than right. Family at bedside

## 2020-06-01 NOTE — ED Provider Notes (Signed)
[facility] EMERGENCY DEPARTMENT Provider Note   CSN: [id number] Arrival date & time: 06/01/20  1417     History Chief Complaint  Patient presents with  . Shortness of Breath    Travis Hampton is a 60 y.o. male.  HPI   Pt is a 60 y/o male with a h/o arthritis, DM, HLD, who presents to the ED today for eval of cough that started a few days ago. Denies hemoptysis or productive cough. Reports associated sob, fevers and ble edema.  Denies chest pain or pleuritic pain. Denies vomiting, diarrhea. Reports rhinorrhea.  States he has had 2 covid shots.   He was seen at [facility] a few days ago and dx with new onset DM. Had lle [place] and CTA chest that was negative. Seen at urgent care pta and had covid test drawn which is still in process.   Reports h/o tobacco use but quit 5-6 years ago.   Past Medical History:  Diagnosis Date  . Arthritis   . Diabetes mellitus without complication ([place])   . High cholesterol   . Numbness    left side face due to tree limb accident, after accident October 2016  . Shoulder pain, left    left shoulder pain  . Torn ligament    left shoulder- found out September 05, 2015    There are no problems to display for this patient.   Past Surgical History:  Procedure Laterality Date  . EYE SURGERY     limb fell on cherry picker and pt was thrown out/12/2014  . LEG SURGERY     due to injury from cherry picker in 01/04/15       Family History  Problem Relation Age of Onset  . Colon cancer Cousin   . Esophageal cancer Neg Hx   . Rectal cancer Neg Hx   . Stomach cancer Neg Hx     Social History   Tobacco Use  . Smoking status: Former Smoker    Packs/day: 0.50    Types: Cigarettes  . Smokeless tobacco: Former Systems developer    Quit date: 09/29/1972  . Tobacco comment: quit July 2017  Substance Use Topics  . Alcohol use: No  . Drug use: No    Home Medications Prior to Admission medications   Medication Sig Start Date End Date Taking?  Authorizing Provider  atorvastatin (LIPITOR) 40 MG tablet Take 20 mg by mouth daily.     [provider]  metFORMIN (GLUCOPHAGE) 500 MG tablet Take 1 tablet (500 mg total) by mouth 2 (two) times daily with a meal. 05/29/20 06/28/20  Venter, Margaux, PA-C  Multiple Vitamin (MULTIVITAMIN) tablet Take 1 tablet by mouth daily. Centrum MVI-Take one daily    [provider]    Allergies    Patient has no known allergies.  Review of Systems   Review of Systems  Constitutional: Positive for chills and fever.  HENT: Positive for rhinorrhea. Negative for ear pain and sore throat.   Eyes: Negative for pain and visual disturbance.  Respiratory: Positive for cough and shortness of breath.   Cardiovascular: Positive for leg swelling. Negative for chest pain.  Gastrointestinal: Negative for abdominal pain, constipation, diarrhea, nausea and vomiting.  Genitourinary: Negative for dysuria and hematuria.  Musculoskeletal: Negative for back pain.  Skin: Negative for rash.  Neurological: Negative for headaches.  All other systems reviewed and are negative.   Physical Exam Updated Vital Signs BP (!) 110/54   Pulse 91   Temp (!)  101.4 F (38.6 C)   Resp (!) 21   SpO2 94%   Physical Exam Vitals and nursing note reviewed.  Constitutional:      Appearance: He is well-developed.  HENT:     Head: Normocephalic and atraumatic.     Mouth/Throat:     Mouth: Mucous membranes are dry.  Eyes:     Conjunctiva/sclera: Conjunctivae normal.  Cardiovascular:     Rate and Rhythm: Regular rhythm. Tachycardia present.     Heart sounds: No murmur heard.   Pulmonary:     Effort: Tachypnea present.     Breath sounds: Decreased breath sounds present.     Comments: Speaking in short sentences, dry cough Abdominal:     General: Bowel sounds are normal.     Palpations: Abdomen is soft.     Tenderness: There is no abdominal tenderness. There is no guarding or rebound.  Musculoskeletal:      Cervical back: Neck supple.     Right lower leg: No tenderness. Edema present.     Left lower leg: Tenderness present. Edema present.  Skin:    General: Skin is warm and dry.  Neurological:     Mental Status: He is alert.     ED Results / Procedures / Treatments   Labs (all labs ordered are listed, but only abnormal results are displayed) Labs Reviewed  BASIC METABOLIC PANEL - Abnormal; Notable for the following components:      Result Value   Sodium 131 (*)    Potassium 3.3 (*)    Chloride 93 (*)    Glucose, Bld 383 (*)    Calcium 8.2 (*)    All other components within normal limits  CBC - Abnormal; Notable for the following components:   RBC 3.75 (*)    Hemoglobin 12.7 (*)    HCT 38.3 (*)    MCV 102.1 (*)    All other components within normal limits  CULTURE, BLOOD (ROUTINE X 2)  CULTURE, BLOOD (ROUTINE X 2)  RESP PANEL BY RT-PCR (FLU A&B, COVID) ARPGX2  LACTIC ACID, PLASMA  LACTIC ACID, PLASMA  BRAIN NATRIURETIC PEPTIDE  TROPONIN I (HIGH SENSITIVITY)  TROPONIN I (HIGH SENSITIVITY)    EKG None  Radiology DG Chest 2 View  Result Date: 06/01/2020 CLINICAL DATA:  Shortness of breath EXAM: CHEST - 2 VIEW COMPARISON:  06/01/2020 prior studies FINDINGS: The cardiomediastinal silhouette is unremarkable. Minimal pulmonary vascular congestion present. There is no evidence of focal airspace disease, pulmonary edema, suspicious pulmonary nodule/mass, pleural effusion, or pneumothorax. No acute bony abnormalities are identified. IMPRESSION: Minimal pulmonary vascular congestion. Electronically Signed   By: Margarette Canada M.D.   On: 06/01/2020 14:59   DG Chest 2 View  Result Date: 06/01/2020 CLINICAL DATA:  Cough and shortness of breath EXAM: CHEST - 2 VIEW COMPARISON:  05/29/20 FINDINGS: The heart size and mediastinal contours are within normal limits. Both lungs are clear. Increased pulmonary vascular congestion without overt edema. No pleural effusion. The visualized skeletal  structures are unremarkable. IMPRESSION: Increased pulmonary vascular congestion without overt edema. Electronically Signed   By: Kerby Moors M.D.   On: 06/01/2020 13:12    Procedures Procedures   Medications Ordered in ED Medications  albuterol (VENTOLIN HFA) 108 (90 Base) MCG/ACT inhaler 6 puff (has no administration in time range)  AeroChamber Plus Flo-Vu Large MISC 1 each (has no administration in time range)  cefTRIAXone (ROCEPHIN) 1 g in sodium chloride 0.9 % 100 mL IVPB (has no administration in time  range)  azithromycin (ZITHROMAX) 500 mg in sodium chloride 0.9 % 250 mL IVPB (has no administration in time range)  potassium chloride SA (KLOR-CON) CR tablet 40 mEq (has no administration in time range)  acetaminophen (TYLENOL) tablet 650 mg (650 mg Oral Given 06/01/20 1509)    ED Course  I have reviewed the triage vital signs and the nursing notes.  Pertinent labs & imaging results that were available during my care of the patient were reviewed by me and considered in my medical decision making (see chart for details).    MDM Rules/Calculators/A&P                          59 shortness of breath, cough and fevers for the last several days.  He was diagnosed with diabetes several days ago and started on Metformin.  Has a Covid test pending.  At that time had a negative lower extremity ultrasound and CTA of the chest.  Reviewed/interpreted labs CBC w/o leukocytosis CMP with hypokalemia, hyperglycemia, otherwise reassuring BNP pending Lactic acid pending Blood cultures obtained COVID pending  EKG with NSR, nonspecific ST abnormality, prolonged QTC  CXR reviewed/interpreted -  Increased pulmonary vascular congestion without overt edema.  At shift change, pt pending laboratory work. Suspect he may need admission for his shortness of breath. Will await covid testing and bnp. Care transitioned to [facility], PA-C at shift change.    Final Clinical Impression(s) / ED  Diagnoses Final diagnoses:  Dyspnea, unspecified type    Rx / DC Orders ED Discharge Orders    None       Bishop Dublin 06/01/20 1610    Daleen Bo, MD 06/01/20 2048

## 2020-06-01 NOTE — ED Notes (Signed)
Pt ambulated around room; O2 stating at 93-96%

## 2020-06-01 NOTE — H&P (Addendum)
History and Physical    Travis Hampton TXM:[id number] DOB: January 15, 1961 DOA: 06/01/2020  PCP: Reynold Bowen, MD (Confirm with patient/family/NH records and if not entered, this has to be entered at [facility] point of entry) Patient coming from: Home  I have personally briefly reviewed patient's old medical records in [place]  Chief Complaint: Cough, SOB  HPI: Travis Hampton is a 60 y.o. 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  Patient was vaccinated for [place] vaccination x2 last year.  Patient started to become sick with generalized weakness, muscle aching and bilateral knee pains at first than started to develop breathing symptoms of dry cough and shortness of breath about 7 to 10 days ago.  Along with subjective fever and chills.  No diarrhea.  No loss of taste.  He went to [facility] ED 4 days ago and was tested negative for Covid.  And he came to our ED 3 days ago 3 days ago underwent CT angiogram negative for PE and DVT study were negative and sent home.  Over the last 2 days, his breathing symptoms became worse also developed pleuritic chest pain each time coughing.  His cough has been dry. ED Course: COVID positive.  Glucose 383, sodium 131, potassium 3.3 WBC 10.2.  Review of Systems: As per HPI otherwise 14 point review of systems negative.    Past Medical History:  Diagnosis Date  . Arthritis   . Diabetes mellitus without complication ([place])   . High cholesterol   . Numbness    left side face due to tree limb accident, after accident October 2016  . Shoulder pain, left    left shoulder pain  . Torn ligament    left shoulder- found out September 05, 2015    Past Surgical History:  Procedure Laterality Date  . EYE SURGERY     limb fell on cherry picker and pt was thrown out/12/2014  . LEG SURGERY     due to injury from cherry picker in 01/04/15      reports that he has quit smoking. His smoking use included cigarettes. He smoked 0.50 packs per day. He quit smokeless tobacco use about 47 years ago. He reports that he does not drink alcohol and does not use drugs.  No Known Allergies  Family History  Problem Relation Age of Onset  . Colon cancer Cousin   . Esophageal cancer Neg Hx   . Rectal cancer Neg Hx   . Stomach cancer Neg Hx      Prior to Admission medications   Medication Sig Start Date End Date Taking? Authorizing Provider  atorvastatin (LIPITOR) 40 MG tablet Take 20 mg by mouth daily.     [provider]  metFORMIN (GLUCOPHAGE) 500 MG tablet Take 1 tablet (500 mg total) by mouth 2 (two) times daily with a meal. 05/29/20 06/28/20  Venter, Margaux, PA-C  Multiple Vitamin (MULTIVITAMIN) tablet Take 1 tablet by mouth daily. Centrum MVI-Take one daily    [provider]    Physical Exam: Vitals:   06/01/20 1700 06/01/20 1723 06/01/20 1730 06/01/20 1800  BP: (!) 92/56  (!) 113/53 105/60  Pulse: 100  97 94  Resp: 15  (!) 24 12  Temp:  99.2 F (37.3 C)    TempSrc:  Oral    SpO2: 95%  93% 94%    Constitutional: NAD, calm, comfortable Vitals:   06/01/20  1700 06/01/20 1723 06/01/20 1730 06/01/20 1800  BP: (!) 92/56  (!) 113/53 105/60  Pulse: 100  97 94  Resp: 15  (!) 24 12  Temp:  99.2 F (37.3 C)    TempSrc:  Oral    SpO2: 95%  93% 94%   Eyes: PERRL, lids and conjunctivae normal ENMT: Mucous membranes are moist. Posterior pharynx clear of any exudate or lesions.Normal dentition.  Neck: normal, supple, no masses, no thyromegaly Respiratory: clear to auscultation bilaterally, no wheezing, no crackles. Increasing respiratory effort. No accessory muscle use.  Cardiovascular: Regular rate and rhythm, no murmurs / rubs / gallops. No extremity edema. 2+ pedal pulses. No carotid bruits.  Abdomen: no tenderness, no masses palpated. No hepatosplenomegaly. Bowel sounds positive.  Musculoskeletal: no  clubbing / cyanosis. No joint deformity upper and lower extremities. Good ROM, no contractures. Normal muscle tone.  Skin: no rashes, lesions, ulcers. No induration Neurologic: CN 2-12 grossly intact. Sensation intact, DTR normal. Strength 5/5 in all 4.  Psychiatric: Normal judgment and insight. Alert and oriented x 3. Normal mood.     Labs on Admission: I have personally reviewed following labs and imaging studies  CBC: Recent Labs  Lab 05/29/20 1330 05/29/20 1500 06/01/20 1432  WBC 8.9  --  10.2  HGB 13.4 12.2* 12.7*  HCT 37.8* 36.0* 38.3*  MCV 98.4  --  102.1*  PLT 120*  --  867   Basic Metabolic Panel: Recent Labs  Lab 05/29/20 1330 05/29/20 1500 06/01/20 1432  NA 130* 134* 131*  K 3.2* 3.1* 3.3*  CL 89*  --  93*  CO2 27  --  29  GLUCOSE 578*  --  383*  BUN 13  --  11  CREATININE 1.05  --  1.00  CALCIUM 8.7*  --  8.2*   GFR: Estimated Creatinine Clearance: 104.9 mL/min (by C-G formula based on SCr of 1 mg/dL). Liver Function Tests: No results for input(s): AST, ALT, ALKPHOS, BILITOT, PROT, ALBUMIN in the last 168 hours. No results for input(s): LIPASE, AMYLASE in the last 168 hours. No results for input(s): AMMONIA in the last 168 hours. Coagulation Profile: No results for input(s): INR, PROTIME in the last 168 hours. Cardiac Enzymes: No results for input(s): CKTOTAL, CKMB, CKMBINDEX, TROPONINI in the last 168 hours. BNP (last 3 results) No results for input(s): PROBNP in the last 8760 hours. HbA1C: No results for input(s): HGBA1C in the last 72 hours. CBG: Recent Labs  Lab 05/29/20 1447 05/29/20 1606 05/29/20 1659 05/29/20 1826  GLUCAP 450* 386* 402* 326*   Lipid Profile: No results for input(s): CHOL, HDL, LDLCALC, TRIG, CHOLHDL, LDLDIRECT in the last 72 hours. Thyroid Function Tests: No results for input(s): TSH, T4TOTAL, FREET4, T3FREE, THYROIDAB in the last 72 hours. Anemia Panel: No results for input(s): VITAMINB12, FOLATE, FERRITIN, TIBC,  IRON, RETICCTPCT in the last 72 hours. Urine analysis: No results found for: COLORURINE, APPEARANCEUR, [place], [place], [place], [place], BILIRUBINUR, KETONESUR, PROTEINUR, UROBILINOGEN, NITRITE, LEUKOCYTESUR  Radiological Exams on Admission: DG Chest 2 View  Result Date: 06/01/2020 CLINICAL DATA:  Shortness of breath EXAM: CHEST - 2 VIEW COMPARISON:  06/01/2020 prior studies FINDINGS: The cardiomediastinal silhouette is unremarkable. Minimal pulmonary vascular congestion present. There is no evidence of focal airspace disease, pulmonary edema, suspicious pulmonary nodule/mass, pleural effusion, or pneumothorax. No acute bony abnormalities are identified. IMPRESSION: Minimal pulmonary vascular congestion. Electronically Signed   By: Margarette Canada M.D.   On: 06/01/2020 14:59   DG Chest 2 View  Result  Date: 06/01/2020 CLINICAL DATA:  Cough and shortness of breath EXAM: CHEST - 2 VIEW COMPARISON:  05/29/20 FINDINGS: The heart size and mediastinal contours are within normal limits. Both lungs are clear. Increased pulmonary vascular congestion without overt edema. No pleural effusion. The visualized skeletal structures are unremarkable. IMPRESSION: Increased pulmonary vascular congestion without overt edema. Electronically Signed   By: Kerby Moors M.D.   On: 06/01/2020 13:12    EKG: Independently reviewed.  Sinus, prolonged QTC  Assessment/Plan Active Problems:   COVID-19 virus infection   COVID-19  (please populate well all problems here in Problem List. (For example, if patient is on BP meds at home and you resume or decide to hold them, it is a problem that needs to be her. Same for CAD, COPD, HLD and so on)   Acute hypoxic respite failure -Secondary to Covid pneumonia. -Start remdesivir steroid and breathing treatment -Encourage proning position, pt agreed.  Prolonged QTC -No acute issue, recheck EKG tomorrow  IIDM uncontrolled hyperglycemia. -Add coverage 3 unit 3 times daily before  meals and sliding scale -Received IV contrast 3 days ago, resume Metformin tomorrow.  Hypokalemia -Replaced, Mg pending.  Liver cirrhosis -Avoid Actemra -Check LFT and INR, outpatient hepatology follow-up  DVT prophylaxis: Lovenox  code Status: Full Code Family Communication: None at bedside Disposition Plan: Expect 2-3 days hospital stay to titrate O2. Consults called: None Admission status: Tele admit   Lequita Halt MD [facility] Pager [phone]  06/01/2020, 6:17 PM

## 2020-06-01 NOTE — ED Triage Notes (Signed)
Pt reports bilateral leg pain, L shoulder pain, L leg swelling, SOB, and non-productive cough for the past few days.  Seen at [facility] on Thursday and [facility] today.  States he was recently diagnosed with diabetes.  Denies chest pain.

## 2020-06-01 NOTE — ED Provider Notes (Signed)
[name]    CSN: [id number] Arrival date & time: 06/01/20  1417      History   Chief Complaint Chief Complaint  Patient presents with  . Shortness of Breath    HPI ZACHARIE PORTNER is a 60 y.o. male.   The history is provided by the patient. No language interpreter was used.  Shortness of Breath Severity:  Moderate Onset quality:  Gradual Duration:  3 days Timing:  Constant Progression:  Worsening Chronicity:  New Relieved by:  Nothing Worsened by:  Nothing Ineffective treatments:  None tried Associated symptoms: no abdominal pain   Pt [facility] for leg swelling.  Pt had ct angio and no pe.  Pt reports he is progressively becoming more short of breath.  Pt recntley diganosed woth diabetes  Past Medical History:  Diagnosis Date  . Arthritis   . Diabetes mellitus without complication ([place])   . High cholesterol   . Numbness    left side face due to tree limb accident, after accident October 2016  . Shoulder pain, left    left shoulder pain  . Torn ligament    left shoulder- found out September 05, 2015    There are no problems to display for this patient.   Past Surgical History:  Procedure Laterality Date  . EYE SURGERY     limb fell on cherry picker and pt was thrown out/12/2014  . LEG SURGERY     due to injury from cherry picker in 01/04/15       Home Medications    Prior to Admission medications   Medication Sig Start Date End Date Taking? Authorizing Provider  atorvastatin (LIPITOR) 40 MG tablet Take 20 mg by mouth daily.     [provider]  metFORMIN (GLUCOPHAGE) 500 MG tablet Take 1 tablet (500 mg total) by mouth 2 (two) times daily with a meal. 05/29/20 06/28/20  Venter, Margaux, PA-C  Multiple Vitamin (MULTIVITAMIN) tablet Take 1 tablet by mouth daily. Centrum MVI-Take one daily    [provider]    Family History Family History  Problem Relation Age of Onset  . Colon cancer Cousin   .  Esophageal cancer Neg Hx   . Rectal cancer Neg Hx   . Stomach cancer Neg Hx     Social History Social History   Tobacco Use  . Smoking status: Former Smoker    Packs/day: 0.50    Types: Cigarettes  . Smokeless tobacco: Former Systems developer    Quit date: 09/29/1972  . Tobacco comment: quit July 2017  Substance Use Topics  . Alcohol use: No  . Drug use: No     Allergies   Patient has no known allergies.   Review of Systems Review of Systems  Respiratory: Positive for shortness of breath.   Gastrointestinal: Negative for abdominal pain.  All other systems reviewed and are negative.    Physical Exam Triage Vital Signs ED Triage Vitals  Enc Vitals Group     BP 06/01/20 1433 (!) 112/50     Pulse Rate 06/01/20 1433 (!) 101     Resp 06/01/20 1433 17     Temp 06/01/20 1433 (!) 101.4 F (38.6 C)     Temp src --      SpO2 06/01/20 1433 94 %     Weight --      Height --      Head Circumference --      Peak Flow --  Pain Score 06/01/20 1426 10     Pain Loc --      Pain Edu? --      Excl. in [place]? --    No data found.  Updated Vital Signs BP (!) 112/50 (BP Location: Right Arm)   Pulse (!) 101   Temp (!) 101.4 F (38.6 C)   Resp 17   SpO2 94%   Visual Acuity Right Eye Distance:   Left Eye Distance:   Bilateral Distance:    Right Eye Near:   Left Eye Near:    Bilateral Near:     Physical Exam Vitals and nursing note reviewed.  Constitutional:      Appearance: He is well-developed.  HENT:     Head: Normocephalic and atraumatic.  Eyes:     Conjunctiva/sclera: Conjunctivae normal.  Cardiovascular:     Rate and Rhythm: Normal rate and regular rhythm.     Heart sounds: No murmur heard.   Pulmonary:     Effort: Tachypnea present. No respiratory distress.     Breath sounds: Decreased breath sounds present.  Abdominal:     Palpations: Abdomen is soft.     Tenderness: There is no abdominal tenderness.  Musculoskeletal:     Cervical back: Neck supple.   Skin:    General: Skin is warm and dry.  Neurological:     General: No focal deficit present.     Mental Status: He is alert.  Psychiatric:        Mood and Affect: Mood normal.      UC Treatments / Results  Labs (all labs ordered are listed, but only abnormal results are displayed) Labs Reviewed  CULTURE, BLOOD (ROUTINE X 2)  CULTURE, BLOOD (ROUTINE X 2)  RESP PANEL BY RT-PCR (FLU A&B, COVID) ARPGX2  BASIC METABOLIC PANEL  CBC  LACTIC ACID, PLASMA  LACTIC ACID, PLASMA  BRAIN NATRIURETIC PEPTIDE  TROPONIN I (HIGH SENSITIVITY)    EKG   Radiology DG Chest 2 View  Result Date: 06/01/2020 CLINICAL DATA:  Shortness of breath EXAM: CHEST - 2 VIEW COMPARISON:  06/01/2020 prior studies FINDINGS: The cardiomediastinal silhouette is unremarkable. Minimal pulmonary vascular congestion present. There is no evidence of focal airspace disease, pulmonary edema, suspicious pulmonary nodule/mass, pleural effusion, or pneumothorax. No acute bony abnormalities are identified. IMPRESSION: Minimal pulmonary vascular congestion. Electronically Signed   By: Margarette Canada M.D.   On: 06/01/2020 14:59   DG Chest 2 View  Result Date: 06/01/2020 CLINICAL DATA:  Cough and shortness of breath EXAM: CHEST - 2 VIEW COMPARISON:  05/29/20 FINDINGS: The heart size and mediastinal contours are within normal limits. Both lungs are clear. Increased pulmonary vascular congestion without overt edema. No pleural effusion. The visualized skeletal structures are unremarkable. IMPRESSION: Increased pulmonary vascular congestion without overt edema. Electronically Signed   By: Kerby Moors M.D.   On: 06/01/2020 13:12    Procedures Procedures (including critical care time)  Medications Ordered in UC Medications  albuterol (VENTOLIN HFA) 108 (90 Base) MCG/ACT inhaler 6 puff (has no administration in time range)  AeroChamber Plus Flo-Vu Large MISC 1 each (has no administration in time range)  cefTRIAXone (ROCEPHIN) 1  g in sodium chloride 0.9 % 100 mL IVPB (has no administration in time range)  azithromycin (ZITHROMAX) 500 mg in sodium chloride 0.9 % 250 mL IVPB (has no administration in time range)  acetaminophen (TYLENOL) tablet 650 mg (650 mg Oral Given 06/01/20 1509)    Initial Impression / Assessment and Plan /  UC Course  I have reviewed the triage vital signs and the nursing notes.  Pertinent labs & imaging results that were available during my care of the patient were reviewed by me and considered in my medical decision making (see chart for details).     MDM:  Pt extremely short of breath with ambulation.  Chest xray shows some new vascular congestion.  I counseled pt, he needs more evaluation that urgent care can provide.  Pt son is here with him and can transport him to ED.   Final Clinical Impressions(s) / UC Diagnoses   Final diagnoses:  Dyspnea, unspecified type   Discharge Instructions   None    ED Prescriptions    None     PDMP not reviewed this encounter.     Fransico Meadow, [place] 06/01/20 1533

## 2020-06-01 NOTE — ED Triage Notes (Signed)
Pt presents with c/o cough with SOB, body aches and fever for past few days.

## 2020-06-02 ENCOUNTER — Inpatient Hospital Stay (HOSPITAL_COMMUNITY): Payer: 59

## 2020-06-02 DIAGNOSIS — R509 Fever, unspecified: Secondary | ICD-10-CM

## 2020-06-02 DIAGNOSIS — U071 COVID-19: Secondary | ICD-10-CM | POA: Diagnosis not present

## 2020-06-02 DIAGNOSIS — B9561 Methicillin susceptible Staphylococcus aureus infection as the cause of diseases classified elsewhere: Secondary | ICD-10-CM

## 2020-06-02 DIAGNOSIS — R7881 Bacteremia: Secondary | ICD-10-CM | POA: Diagnosis not present

## 2020-06-02 LAB — URINALYSIS, ROUTINE W REFLEX MICROSCOPIC
Bacteria, UA: NONE SEEN
Bilirubin Urine: NEGATIVE
Glucose, UA: >=500 mg/dL — AB
Hgb urine dipstick: NEGATIVE
Ketones, ur: 5 mg/dL — AB
Leukocytes,Ua: NEGATIVE
Nitrite: NEGATIVE
Protein, ur: NEGATIVE mg/dL
Specific Gravity, Urine: 1.017 (ref 1.005–1.030)
pH: 6 (ref 5.0–8.0)

## 2020-06-02 LAB — COMPREHENSIVE METABOLIC PANEL
ALT: 25 U/L (ref 0–44)
AST: 30 U/L (ref 15–41)
Albumin: 2.3 g/dL — ABNORMAL LOW (ref 3.5–5.0)
Alkaline Phosphatase: 90 U/L (ref 38–126)
Anion gap: 6 (ref 5–15)
BUN: 15 mg/dL (ref 8–23)
CO2: 27 mmol/L (ref 22–32)
Calcium: 7.8 mg/dL — ABNORMAL LOW (ref 8.9–10.3)
Chloride: 98 mmol/L (ref 98–111)
Creatinine, Ser: 0.93 mg/dL (ref 0.61–1.24)
GFR, Estimated: >60 mL/min (ref >60)
Glucose, Bld: 460 mg/dL — ABNORMAL HIGH (ref 70–99)
Potassium: 4.1 mmol/L (ref 3.5–5.1)
Sodium: 131 mmol/L — ABNORMAL LOW (ref 135–145)
Total Bilirubin: 1.5 mg/dL — ABNORMAL HIGH (ref 0.3–1.2)
Total Protein: 6.3 g/dL — ABNORMAL LOW (ref 6.5–8.1)

## 2020-06-02 LAB — CBC WITH DIFFERENTIAL/PLATELET
Abs Immature Granulocytes: 0.04 10*3/uL (ref 0.00–0.07)
Basophils Absolute: 0 10*3/uL (ref 0.0–0.1)
Basophils Relative: 0 %
Eosinophils Absolute: 0 10*3/uL (ref 0.0–0.5)
Eosinophils Relative: 0 %
HCT: 32.5 % — ABNORMAL LOW (ref 39.0–52.0)
Hemoglobin: 11 g/dL — ABNORMAL LOW (ref 13.0–17.0)
Immature Granulocytes: 1 %
Lymphocytes Relative: 6 %
Lymphs Abs: 0.4 10*3/uL — ABNORMAL LOW (ref 0.7–4.0)
MCH: 34 pg (ref 26.0–34.0)
MCHC: 33.8 g/dL (ref 30.0–36.0)
MCV: 100.3 fL — ABNORMAL HIGH (ref 80.0–100.0)
Monocytes Absolute: 0.2 10*3/uL (ref 0.1–1.0)
Monocytes Relative: 3 %
Neutro Abs: 6.9 10*3/uL (ref 1.7–7.7)
Neutrophils Relative %: 90 %
Platelets: 106 10*3/uL — ABNORMAL LOW (ref 150–400)
RBC: 3.24 MIL/uL — ABNORMAL LOW (ref 4.22–5.81)
RDW: 12.7 % (ref 11.5–15.5)
WBC: 7.5 10*3/uL (ref 4.0–10.5)
nRBC: 0 % (ref 0.0–0.2)

## 2020-06-02 LAB — BLOOD CULTURE ID PANEL (REFLEXED) - BCID2

## 2020-06-02 LAB — COVID-19, FLU A+B NAA
Influenza A, NAA: NOT DETECTED
Influenza B, NAA: NOT DETECTED
SARS-CoV-2, NAA: NOT DETECTED

## 2020-06-02 LAB — SODIUM, URINE, RANDOM: Sodium, Ur: <10 mmol/L

## 2020-06-02 LAB — C-REACTIVE PROTEIN: CRP: 17.6 mg/dL — ABNORMAL HIGH (ref <1.0)

## 2020-06-02 LAB — OSMOLALITY, URINE: Osmolality, Ur: 458 mOsm/kg (ref 300–900)

## 2020-06-02 LAB — GLUCOSE, CAPILLARY
Glucose-Capillary: 431 mg/dL — ABNORMAL HIGH (ref 70–99)
Glucose-Capillary: 410 mg/dL — ABNORMAL HIGH (ref 70–99)
Glucose-Capillary: 332 mg/dL — ABNORMAL HIGH (ref 70–99)
Glucose-Capillary: 251 mg/dL — ABNORMAL HIGH (ref 70–99)
Glucose-Capillary: 251 mg/dL — ABNORMAL HIGH (ref 70–99)

## 2020-06-02 LAB — D-DIMER, QUANTITATIVE: D-Dimer, Quant: 1.94 ug/mL-FEU — ABNORMAL HIGH (ref 0.00–0.50)

## 2020-06-02 LAB — PROCALCITONIN: Procalcitonin: 0.7 ng/mL

## 2020-06-02 LAB — BRAIN NATRIURETIC PEPTIDE: B Natriuretic Peptide: 4243.4 pg/mL — ABNORMAL HIGH (ref 0.0–100.0)

## 2020-06-02 LAB — URIC ACID: Uric Acid, Serum: 3.6 mg/dL — ABNORMAL LOW (ref 3.7–8.6)

## 2020-06-02 LAB — CREATININE, URINE, RANDOM: Creatinine, Urine: 47.15 mg/dL

## 2020-06-02 LAB — FERRITIN: Ferritin: 1777 ng/mL — ABNORMAL HIGH (ref 24–336)

## 2020-06-02 LAB — HEMOGLOBIN A1C
Hgb A1c MFr Bld: 10.9 % — ABNORMAL HIGH (ref 4.8–5.6)
Mean Plasma Glucose: 266.13 mg/dL

## 2020-06-02 LAB — PHOSPHORUS: Phosphorus: 2.9 mg/dL (ref 2.5–4.6)

## 2020-06-02 LAB — MAGNESIUM: Magnesium: 1.8 mg/dL (ref 1.7–2.4)

## 2020-06-02 MED ORDER — INSULIN GLARGINE 100 UNIT/ML ~~LOC~~ SOLN
25.0000 [IU] | Freq: Every day | SUBCUTANEOUS | Status: DC
  Administered 2020-06-02 – 2020-06-04 (×3): 25 [IU] via SUBCUTANEOUS
  Filled 2020-06-02 (×4): qty 0.25

## 2020-06-02 MED ORDER — CEFAZOLIN SODIUM-DEXTROSE 2-4 GM/100ML-% IV SOLN
2.0000 g | Freq: Three times a day (TID) | INTRAVENOUS | Status: DC
  Administered 2020-06-02: 2 g via INTRAVENOUS
  Filled 2020-06-02 (×2): qty 100

## 2020-06-02 MED ORDER — INSULIN ASPART 100 UNIT/ML ~~LOC~~ SOLN
0.0000 [IU] | Freq: Every day | SUBCUTANEOUS | Status: DC
  Administered 2020-06-02: 3 [IU] via SUBCUTANEOUS
  Administered 2020-06-03 – 2020-06-05 (×3): 2 [IU] via SUBCUTANEOUS

## 2020-06-02 MED ORDER — METHYLPREDNISOLONE SODIUM SUCC 125 MG IJ SOLR
60.0000 mg | Freq: Two times a day (BID) | INTRAMUSCULAR | Status: DC
  Administered 2020-06-02 – 2020-06-03 (×2): 60 mg via INTRAVENOUS
  Filled 2020-06-02 (×2): qty 2

## 2020-06-02 MED ORDER — INSULIN STARTER KIT- PEN NEEDLES (ENGLISH)
1.0000 | Freq: Once | Status: AC
  Administered 2020-06-02: 1
  Filled 2020-06-02: qty 1

## 2020-06-02 MED ORDER — INSULIN GLARGINE 100 UNIT/ML ~~LOC~~ SOLN: 20.0000 [IU] | Freq: Every day | SUBCUTANEOUS | Status: DC

## 2020-06-02 MED ORDER — LACTATED RINGERS IV SOLN: INTRAVENOUS | Status: AC

## 2020-06-02 MED ORDER — INSULIN ASPART 100 UNIT/ML ~~LOC~~ SOLN
10.0000 [IU] | Freq: Once | SUBCUTANEOUS | Status: AC
  Administered 2020-06-02: 10 [IU] via SUBCUTANEOUS

## 2020-06-02 MED ORDER — INSULIN ASPART 100 UNIT/ML ~~LOC~~ SOLN
0.0000 [IU] | Freq: Three times a day (TID) | SUBCUTANEOUS | Status: DC
  Administered 2020-06-02: 15 [IU] via SUBCUTANEOUS
  Administered 2020-06-02: 11 [IU] via SUBCUTANEOUS
  Administered 2020-06-03: 7 [IU] via SUBCUTANEOUS
  Administered 2020-06-03: 20 [IU] via SUBCUTANEOUS
  Administered 2020-06-03: 11 [IU] via SUBCUTANEOUS
  Administered 2020-06-04: 4 [IU] via SUBCUTANEOUS
  Administered 2020-06-04: 11 [IU] via SUBCUTANEOUS
  Administered 2020-06-04: 4 [IU] via SUBCUTANEOUS
  Administered 2020-06-05: 7 [IU] via SUBCUTANEOUS
  Administered 2020-06-05: 3 [IU] via SUBCUTANEOUS
  Administered 2020-06-05: 4 [IU] via SUBCUTANEOUS

## 2020-06-02 MED ORDER — CEFAZOLIN SODIUM-DEXTROSE 2-4 GM/100ML-% IV SOLN
2.0000 g | Freq: Three times a day (TID) | INTRAVENOUS | Status: DC
  Administered 2020-06-02 – 2020-06-07 (×16): 2 g via INTRAVENOUS
  Filled 2020-06-02 (×20): qty 100

## 2020-06-02 MED ORDER — IPRATROPIUM-ALBUTEROL 20-100 MCG/ACT IN AERS
1.0000 | INHALATION_SPRAY | Freq: Four times a day (QID) | RESPIRATORY_TRACT | Status: DC | PRN
  Administered 2020-06-02 – 2020-06-04 (×2): 1 via RESPIRATORY_TRACT
  Filled 2020-06-02: qty 4

## 2020-06-02 MED ORDER — LIVING WELL WITH DIABETES BOOK
Freq: Once | Status: AC
  Filled 2020-06-02: qty 1

## 2020-06-02 MED ORDER — LEVOFLOXACIN 500 MG PO TABS
500.0000 mg | ORAL_TABLET | Freq: Every day | ORAL | Status: DC
  Administered 2020-06-02: 500 mg via ORAL
  Filled 2020-06-02: qty 1

## 2020-06-02 MED ORDER — INSULIN ASPART 100 UNIT/ML ~~LOC~~ SOLN
4.0000 [IU] | Freq: Three times a day (TID) | SUBCUTANEOUS | Status: DC
  Administered 2020-06-02 – 2020-06-04 (×8): 4 [IU] via SUBCUTANEOUS

## 2020-06-02 NOTE — Consult Note (Signed)
Travis Hampton for Infectious Disease       Reason for Consult: MSSA bacteremia     Referring Physician: CHAMP autoconsult  Active Problems:   COVID-19 virus infection   COVID   . atorvastatin  20 mg Oral Daily  . enoxaparin (LOVENOX) injection  40 mg Subcutaneous Q24H  . insulin aspart  0-20 Units Subcutaneous TID WC  . insulin aspart  0-5 Units Subcutaneous QHS  . insulin aspart  4 Units Subcutaneous TID WC  . insulin glargine  25 Units Subcutaneous Daily  . insulin starter kit- pen needles  1 kit Other Once  . levofloxacin  500 mg Oral Daily  . methylPREDNISolone (SOLU-MEDROL) injection  60 mg Intravenous Q12H    Recommendations:  continue with cefazolin, started this am Consider stopping levaquin Remdesivir/steroids per primary team Repeat blood cultures TTE   Assessment: He has bacteremia with BCID positive for MSSA in 4/4 blood cultures bottles with fever and recent/current COVID-19 infection.  No concerns on exam or by history with no back pain.  Knees and legs are without concerns on exam.    Levaquin started with concern for bronchitis with a negative CXR for pneumonia.  He currently is on room air, breathing comfortably and minimal cough by his report now.  Consider stopping levaquin.    Antibiotics: remdesivir day 2 Azithromycin x 1 Cefazolin day 1 Levofloxacin day 1  HPI: Travis Hampton is a 60 y.o. male with recently diagnosed new diabetes with a Hgb A1c of 10.9, COVID-19 positive infection and came in now with weakness, muscle aches and some sob and also now positive for MSSA bacteremia in 4/4 blood culture bottles.  Baseline procalcitonin only 0.7 and not requiring any oxygen at this time.  He had noted bilateral leg pain, particularly in his knees but that is better today.  No new back pain.  He feels better since admission.  CXR without any significant findings.  Recent CT with a 5 mm left lower lobe nodule.     Review of Systems:   Constitutional: negative for chills Gastrointestinal: negative for diarrhea Integument/breast: negative for rash All other systems reviewed and are negative    Past Medical History:  Diagnosis Date  . Arthritis   . Diabetes mellitus without complication ([place])   . High cholesterol   . Numbness    left side face due to tree limb accident, after accident October 2016  . Shoulder pain, left    left shoulder pain  . Torn ligament    left shoulder- found out September 05, 2015    Social History   Tobacco Use  . Smoking status: Former Smoker    Packs/day: 0.50    Types: Cigarettes  . Smokeless tobacco: Former Systems developer    Quit date: 09/29/1972  . Tobacco comment: quit July 2017  Substance Use Topics  . Alcohol use: No  . Drug use: No    Family History  Problem Relation Age of Onset  . Colon cancer Cousin   . Esophageal cancer Neg Hx   . Rectal cancer Neg Hx   . Stomach cancer Neg Hx     No Known Allergies  Physical Exam: Constitutional: in no apparent distress  Vitals:   06/02/20 0800 06/02/20 1200  BP: (!) 112/58 (!) 100/55  Pulse: 80 77  Resp: 19 18  Temp: (!) 97.5 F (36.4 C) 97.9 F (36.6 C)  SpO2: 94% 93%   EYES: anicteric ENMT: no thrush Cardiovascular: Cor RRR Respiratory: clear;  Musculoskeletal: no pedal edema noted Skin: negatives: no rash Neuro: non-focal  Lab Results  Component Value Date   WBC 7.5 06/02/2020   HGB 11.0 (L) 06/02/2020   HCT 32.5 (L) 06/02/2020   MCV 100.3 (H) 06/02/2020   PLT 106 (L) 06/02/2020    Lab Results  Component Value Date   CREATININE 0.93 06/02/2020   BUN 15 06/02/2020   NA 131 (L) 06/02/2020   K 4.1 06/02/2020   CL 98 06/02/2020   CO2 27 06/02/2020    Lab Results  Component Value Date   ALT 25 06/02/2020   AST 30 06/02/2020   ALKPHOS 90 06/02/2020     Microbiology: Recent Results (from the past 240 hour(s))  Resp Panel by RT-PCR (Flu A&B, Covid) Nasopharyngeal Swab     Status: Abnormal   Collection Time:  06/01/20  3:35 PM   Specimen: Nasopharyngeal Swab; Nasopharyngeal(NP) swabs in vial transport medium  Result Value Ref Range Status   SARS Coronavirus 2 by RT PCR POSITIVE (A) NEGATIVE Final    Comment: RESULT CALLED TO, READ BACK BY AND VERIFIED WITH: (NOTE) SARS-CoV-2 target nucleic acids are DETECTED.  The SARS-CoV-2 RNA is generally detectable in upper respiratory specimens during the acute phase of infection. Positive results are indicative of the presence of the identified virus, but do not rule out bacterial infection or co-infection with other pathogens not detected by the test. Clinical correlation with patient history and other diagnostic information is necessary to determine patient infection status. The expected result is Negative.  Fact Sheet for Patients: EntrepreneurPulse.com.au  Fact Sheet for Healthcare Providers: IncredibleEmployment.be  This test is not yet approved or cleared by the [place] FDA and  has been authorized for detection and/or diagnosis of SARS-CoV-2 by FDA under an Emergency Use Authorization (EUA).  This EUA will remain in effect (meaning this test can be used) for the duration of  the [place] VID-19 declaration under Section 564(b)(1) of the Act, 21 U.S.C. section 360bbb-3(b)(1), unless the authorization is terminated or revoked sooner.     Influenza A by PCR NEGATIVE NEGATIVE Final    Comment: A,OLEARY $RemoveBeforeDEI'[id number]'[id number]$  06/01/20 EB   Influenza B by PCR NEGATIVE NEGATIVE Final    Comment: (NOTE) The Xpert Xpress SARS-CoV-2/FLU/RSV plus assay is intended as an aid in the diagnosis of influenza from Nasopharyngeal swab specimens and should not be used as a sole basis for treatment. Nasal washings and aspirates are unacceptable for Xpert Xpress SARS-CoV-2/FLU/RSV testing.  Fact Sheet for Patients: EntrepreneurPulse.com.au  Fact Sheet for Healthcare  Providers: IncredibleEmployment.be  This test is not yet approved or cleared by the [place] FDA and has been authorized for detection and/or diagnosis of SARS-CoV-2 by FDA under an Emergency Use Authorization (EUA). This EUA will remain in effect (meaning this test can be used) for the duration of the COVID-19 declaration under Section 564(b)(1) of the Act, 21 U.S.C. section 360bbb-3(b)(1), unless the authorization is terminated or revoked.  Performed at [facility], [place] [address] [place], [place] [address]   Blood culture (routine x 2)     Status: None (Preliminary result)   Collection Time: 06/01/20  4:00 PM   Specimen: BLOOD RIGHT FOREARM  Result Value Ref Range Status   Specimen Description BLOOD RIGHT FOREARM  Final   Special Requests   Final    BOTTLES DRAWN AEROBIC AND ANAEROBIC Blood Culture adequate volume   Culture  Setup Time   Final    GRAM POSITIVE COCCI IN CLUSTERS  IN BOTH AEROBIC AND ANAEROBIC BOTTLES Organism ID to follow CRITICAL RESULT CALLED TO, READ BACK BY AND VERIFIED WITH: Salli Real, AT 0603 06/02/20 [facility] [place] Performed at [facility], [place] [address] [place], [place] [address]    Culture GRAM POSITIVE COCCI  Final   Report Status PENDING  Incomplete  Blood Culture ID Panel (Reflexed)     Status: Abnormal   Collection Time: 06/01/20  4:00 PM  Result Value Ref Range Status   Enterococcus faecalis NOT DETECTED NOT DETECTED Final   Enterococcus Faecium NOT DETECTED NOT DETECTED Final   Listeria monocytogenes NOT DETECTED NOT DETECTED Final   Staphylococcus species DETECTED (A) NOT DETECTED Final    Comment: CRITICAL RESULT CALLED TO, READ BACK BY AND VERIFIED WITH: G. ABBOTT PHARMD, AT 0603 06/02/20 D. VANHOOK    Staphylococcus aureus (BCID) DETECTED (A) NOT DETECTED Final    Comment: CRITICAL RESULT CALLED TO, READ BACK BY AND VERIFIED WITH: G. ABBOTT PHARMD, AT 0603 06/02/20 D. VANHOOK    Staphylococcus  epidermidis NOT DETECTED NOT DETECTED Final   Staphylococcus lugdunensis NOT DETECTED NOT DETECTED Final   Streptococcus species NOT DETECTED NOT DETECTED Final   Streptococcus agalactiae NOT DETECTED NOT DETECTED Final   Streptococcus pneumoniae NOT DETECTED NOT DETECTED Final   Streptococcus pyogenes NOT DETECTED NOT DETECTED Final   A.calcoaceticus-baumannii NOT DETECTED NOT DETECTED Final   Bacteroides fragilis NOT DETECTED NOT DETECTED Final   Enterobacterales NOT DETECTED NOT DETECTED Final   Enterobacter cloacae complex NOT DETECTED NOT DETECTED Final   Escherichia coli NOT DETECTED NOT DETECTED Final   Klebsiella aerogenes NOT DETECTED NOT DETECTED Final   Klebsiella oxytoca NOT DETECTED NOT DETECTED Final   Klebsiella pneumoniae NOT DETECTED NOT DETECTED Final   Proteus species NOT DETECTED NOT DETECTED Final   Salmonella species NOT DETECTED NOT DETECTED Final   Serratia marcescens NOT DETECTED NOT DETECTED Final   Haemophilus influenzae NOT DETECTED NOT DETECTED Final   Neisseria meningitidis NOT DETECTED NOT DETECTED Final   Pseudomonas aeruginosa NOT DETECTED NOT DETECTED Final   Stenotrophomonas maltophilia NOT DETECTED NOT DETECTED Final   Candida albicans NOT DETECTED NOT DETECTED Final   Candida auris NOT DETECTED NOT DETECTED Final   Candida glabrata NOT DETECTED NOT DETECTED Final   Candida krusei NOT DETECTED NOT DETECTED Final   Candida parapsilosis NOT DETECTED NOT DETECTED Final   Candida tropicalis NOT DETECTED NOT DETECTED Final   Cryptococcus neoformans/gattii NOT DETECTED NOT DETECTED Final   Meth resistant mecA/C and MREJ NOT DETECTED NOT DETECTED Final    Comment: Performed at [facility], [address] [place], [place] [address]  Blood culture (routine x 2)     Status: None (Preliminary result)   Collection Time: 06/01/20  4:02 PM   Specimen: BLOOD LEFT FOREARM  Result Value Ref Range Status   Specimen Description BLOOD LEFT FOREARM  Final    Special Requests   Final    BOTTLES DRAWN AEROBIC AND ANAEROBIC Blood Culture adequate volume   Culture  Setup Time   Final    GRAM POSITIVE COCCI IN CLUSTERS IN BOTH AEROBIC AND ANAEROBIC BOTTLES Performed at [facility], [place] [address], [place], [place] [address]    Culture GRAM POSITIVE COCCI  Final   Report Status PENDING  Incomplete    Thayer Headings, Page for Infectious Disease [email or web address] 06/02/2020, 1:43 PM

## 2020-06-02 NOTE — Progress Notes (Signed)
PHARMACY - PHYSICIAN COMMUNICATION CRITICAL VALUE ALERT - BLOOD CULTURE IDENTIFICATION (BCID)  Travis Hampton is an 60 y.o. male who presented to [facility] on 06/01/2020 with a chief complaint of fever/SOB/COVID PNA  Assessment:   2/2 blood cultures growing MSSA  Name of physician (or Provider) Contacted:  Dr. Myna Hidalgo  Current antibiotics: None  Changes to prescribed antibiotics recommended:  Start Ancef 2 g IV q8h  Results for orders placed or performed during the hospital encounter of 06/01/20  Blood Culture ID Panel (Reflexed) (Collected: 06/01/2020  4:00 PM)  Result Value Ref Range   Enterococcus faecalis NOT DETECTED NOT DETECTED   Enterococcus Faecium NOT DETECTED NOT DETECTED   Listeria monocytogenes NOT DETECTED NOT DETECTED   Staphylococcus species DETECTED (A) NOT DETECTED   Staphylococcus aureus (BCID) DETECTED (A) NOT DETECTED   Staphylococcus epidermidis NOT DETECTED NOT DETECTED   Staphylococcus lugdunensis NOT DETECTED NOT DETECTED   Streptococcus species NOT DETECTED NOT DETECTED   Streptococcus agalactiae NOT DETECTED NOT DETECTED   Streptococcus pneumoniae NOT DETECTED NOT DETECTED   Streptococcus pyogenes NOT DETECTED NOT DETECTED   A.calcoaceticus-baumannii NOT DETECTED NOT DETECTED   Bacteroides fragilis NOT DETECTED NOT DETECTED   Enterobacterales NOT DETECTED NOT DETECTED   Enterobacter cloacae complex NOT DETECTED NOT DETECTED   Escherichia coli NOT DETECTED NOT DETECTED   Klebsiella aerogenes NOT DETECTED NOT DETECTED   Klebsiella oxytoca NOT DETECTED NOT DETECTED   Klebsiella pneumoniae NOT DETECTED NOT DETECTED   Proteus species NOT DETECTED NOT DETECTED   Salmonella species NOT DETECTED NOT DETECTED   Serratia marcescens NOT DETECTED NOT DETECTED   Haemophilus influenzae NOT DETECTED NOT DETECTED   Neisseria meningitidis NOT DETECTED NOT DETECTED   Pseudomonas aeruginosa NOT DETECTED NOT DETECTED   Stenotrophomonas maltophilia NOT DETECTED NOT  DETECTED   Candida albicans NOT DETECTED NOT DETECTED   Candida auris NOT DETECTED NOT DETECTED   Candida glabrata NOT DETECTED NOT DETECTED   Candida krusei NOT DETECTED NOT DETECTED   Candida parapsilosis NOT DETECTED NOT DETECTED   Candida tropicalis NOT DETECTED NOT DETECTED   Cryptococcus neoformans/gattii NOT DETECTED NOT DETECTED   Meth resistant mecA/C and MREJ NOT DETECTED NOT DETECTED    Travis Hampton 06/02/2020  6:11 AM

## 2020-06-02 NOTE — Plan of Care (Signed)
  Problem: Education: Goal: Knowledge of General Education information will improve Description: Including pain rating scale, medication(s)/side effects and non-pharmacologic comfort measures Outcome: Progressing   Problem: Clinical Measurements: Goal: Ability to maintain clinical measurements within normal limits will improve Outcome: Progressing Goal: Will remain free from infection Outcome: Progressing Goal: Diagnostic test results will improve Outcome: Progressing Goal: Respiratory complications will improve Outcome: Progressing Goal: Cardiovascular complication will be avoided Outcome: Progressing   Problem: Nutrition: Goal: Adequate nutrition will be maintained Outcome: Progressing   Problem: Elimination: Goal: Will not experience complications related to bowel motility Outcome: Progressing Goal: Will not experience complications related to urinary retention Outcome: Progressing

## 2020-06-02 NOTE — Progress Notes (Signed)
PROGRESS NOTE                                                                                                                                                                                                             Patient Demographics:    Travis Hampton, is a 60 y.o. male, DOB - Apr 30, 1960, MWN:[id number]  Outpatient Primary MD for the patient is Reynold Bowen, MD    LOS - 1  Admit date - 06/01/2020    Chief Complaint  Patient presents with  . Shortness of Breath       Brief Narrative (HPI from H&P) - Travis Hampton is a 60 y.o. 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   Subjective:    Travis Hampton today has, No headache, No chest pain, No abdominal pain - No Nausea, No new weakness tingling or numbness, improved cough and shortness of breath.   Assessment  & Plan :     1. Acute Hypoxic Resp. Failure due to Acute Covid 19 Viral Pneumonitis during the ongoing 2020 Covid 19 Pandemic - he is partially vaccinated with 2 mRNA shots, he has been started on IV steroids & Remdesivir. Encouraged the patient to sit up in chair in the daytime use I-S and flutter valve for pulmonary toiletry.  Will advance activity and titrate down oxygen as possible.   SpO2: 94 %  Recent Labs  Lab 05/29/20 1330 05/29/20 1500 06/01/20 1432 06/01/20 1535 06/01/20 1654 06/01/20 1913 06/02/20 0246  WBC 8.9  --  10.2  --   --   --  7.5  HGB 13.4 12.2* 12.7*  --   --   --  11.0*  HCT 37.8* 36.0* 38.3*  --   --   --  32.5*  PLT 120*  --  154  --   --   --  106*  CRP  --   --   --   --   --  18.6* 17.6*  BNP  --   --  190.2*  --   --  197.8*  --   DDIMER  --   --   --   --   --  2.15* 1.94*  PROCALCITON  --   --   --   --   --  0.92  --   AST  --    --   --   --   --  39 30  ALT  --   --   --   --   --  26 25  ALKPHOS  --   --   --   --   --  100 90  BILITOT  --   --   --   --   --  2.4* 1.5*  ALBUMIN  --   --   --   --   --  2.6* 2.3*  INR  --   --   --   --   --  1.5*  --   LATICACIDVEN  --   --   --   --  2.1* 2.9*  --   SARSCOV2NAA  --   --   --  POSITIVE*  --   --   --       2.  Possible mild bacterial bronchitis - pro calcitonin was borderline, challenge with 4 days of oral Levaquin and monitor.  3.  Dyslipidemia.  On statin continue.  4.  Elevated lactic acid, hydrate, hold Glucophage.    5. DM 2 -poor outpatient control due to hyperglycemia: Placed on Lantus along with sliding scale, DM and insulin education while he is here.  Lab Results  Component Value Date   HGBA1C 10.9 (H) 06/02/2020   CBG (last 3)  Recent Labs    06/01/20 2235 06/02/20 0148 06/02/20 0822  GLUCAP 424* 431* 410*         Condition - Fair  Family Communication  : Sherryll Burger [phone] 06/02/20  Code Status :  Full  Consults  :  None  PUD Prophylaxis : None   Procedures  :            Disposition Plan  :    Status is: Inpatient  Remains inpatient appropriate because:IV treatments appropriate due to intensity of illness or inability to take PO   Dispo: The patient is from: Home              Anticipated d/c is to: Home              Patient currently is not medically stable to d/c.   Difficult to place patient No  DVT Prophylaxis  :  Lovenox    Lab Results  Component Value Date   PLT 106 (L) 06/02/2020    Diet :  Diet Order            Diet heart healthy/carb modified Room service appropriate? Yes; Fluid consistency: Thin  Diet effective now                  Inpatient Medications  Scheduled Meds: . atorvastatin  20 mg Oral Daily  . enoxaparin (LOVENOX) injection  40 mg Subcutaneous Q24H  . insulin aspart  0-20 Units Subcutaneous TID WC  . insulin aspart  4 Units Subcutaneous TID WC  . metFORMIN  500 mg  Oral BID WC  . methylPREDNISolone (SOLU-MEDROL) injection  1 mg/kg Intravenous Q12H   Followed by  . [START ON 06/04/2020] predniSONE  50 mg Oral Daily   Continuous Infusions: .  ceFAZolin (ANCEF) IV 2 g (06/02/20 0654)  . remdesivir 100 mg in NS 100 mL 100 mg (06/02/20 0921)   PRN Meds:.acetaminophen, guaiFENesin-dextromethorphan, Ipratropium-Albuterol, oxyCODONE, senna-docusate  Antibiotics  :    Anti-infectives (From admission, onward)   Start     Dose/Rate Route Frequency Ordered Stop   06/02/20 1000  remdesivir 100 mg in sodium chloride 0.9 % 100 mL IVPB       "Followed by" Linked Group Details   100 mg 200 mL/hr over 30 Minutes Intravenous Daily 06/01/20 1816 06/06/20 0959   06/02/20 0645  ceFAZolin (ANCEF) IVPB 2g/100 mL premix        2 g 200 mL/hr over 30 Minutes Intravenous Every 8 hours 06/02/20 0613     06/01/20 1815  remdesivir 200 mg in sodium chloride 0.9% 250 mL IVPB       "Followed by" Linked Group Details   200 mg 580 mL/hr over 30 Minutes Intravenous Once 06/01/20 1816 06/01/20 2013   06/01/20 1515  cefTRIAXone (ROCEPHIN) 1 g in sodium chloride 0.9 % 100 mL IVPB        1 g 200 mL/hr over 30 Minutes Intravenous  Once 06/01/20 1508 06/01/20 1642   06/01/20 1515  azithromycin (ZITHROMAX) 500 mg in sodium chloride 0.9 % 250 mL IVPB        500 mg 250 mL/hr over 60 Minutes Intravenous  Once 06/01/20 1508 06/01/20 1757       Time Spent in minutes  30   Lala Lund M.D on 06/02/2020 at 10:31 AM  To page go to [email or web address]   [facility] -  Office  [phone]    See all Orders from today for further details    Objective:   Vitals:   06/02/20 0222 06/02/20 0223 06/02/20 0400 06/02/20 0800  BP: 104/60   (!) 112/58  Pulse: 70   80  Resp: 19   19  Temp: 98.9 F (37.2 C)  97.9 F (36.6 C) (!) 97.5 F (36.4 C)  TempSrc: Oral  Oral Oral  SpO2:    94%  Weight:  118.6 kg    Height:  6\' 2"  (1.88 m)      Wt Readings from Last 3 Encounters:   06/02/20 118.6 kg  05/29/20 115.7 kg  10/14/15 134.3 kg     Intake/Output Summary (Last 24 hours) at 06/02/2020 1031 Last data filed at 06/02/2020 0654 Gross per 24 hour  Intake 628.97 ml  Output 1000 ml  Net -371.03 ml     Physical Exam  Awake Alert, No new F.N deficits, Normal affect [place].AT,PERRAL Supple Neck,No JVD, No cervical lymphadenopathy appriciated.  Symmetrical Chest wall movement, Good air movement bilaterally, CTAB RRR,No Gallops,Rubs or new Murmurs, No Parasternal Heave +ve B.Sounds, Abd Soft, No tenderness, No organomegaly appriciated, No rebound - guarding or rigidity. No Cyanosis, Clubbing or edema, No new Rash or bruise      Data Review:    CBC Recent Labs  Lab 05/29/20 1330 05/29/20 1500 06/01/20 1432 06/02/20 0246  WBC 8.9  --  10.2 7.5  HGB 13.4 12.2* 12.7* 11.0*  HCT 37.8* 36.0* 38.3* 32.5*  PLT 120*  --  154 106*  MCV 98.4  --  102.1* 100.3*  MCH 34.9*  --  33.9 34.0  MCHC 35.4  --  33.2 33.8  RDW 12.5  --  12.7 12.7  LYMPHSABS  --   --   --  0.4*  MONOABS  --   --   --  0.2  EOSABS  --   --   --  0.0  BASOSABS  --   --   --  0.0    Recent Labs  Lab 05/29/20 1330 05/29/20 1500 06/01/20 1432 06/01/20 1654 06/01/20 1913 06/02/20 0246  NA 130* 134* 131*  --   --  131*  K 3.2* 3.1* 3.3*  --   --  4.1  CL 89*  --  93*  --   --  98  CO2 27  --  29  --   --  27  GLUCOSE 578*  --  383*  --   --  460*  BUN 13  --  11  --   --  15  CREATININE 1.05  --  1.00  --   --  0.93  CALCIUM 8.7*  --  8.2*  --   --  7.8*  AST  --   --   --   --  39 30  ALT  --   --   --   --  26 25  ALKPHOS  --   --   --   --  100 90  BILITOT  --   --   --   --  2.4* 1.5*  ALBUMIN  --   --   --   --  2.6* 2.3*  MG  --   --   --   --   --  1.8  CRP  --   --   --   --  18.6* 17.6*  DDIMER  --   --   --   --  2.15* 1.94*  PROCALCITON  --   --   --   --  0.92  --   LATICACIDVEN  --   --   --  2.1* 2.9*  --   INR  --   --   --   --  1.5*  --   HGBA1C  --   --    --   --   --  10.9*  BNP  --   --  190.2*  --  197.8*  --     ------------------------------------------------------------------------------------------------------------------ No results for input(s): CHOL, HDL, LDLCALC, TRIG, CHOLHDL, LDLDIRECT in the last 72 hours.  Lab Results  Component Value Date   HGBA1C 10.9 (H) 06/02/2020   ------------------------------------------------------------------------------------------------------------------ No results for input(s): TSH, T4TOTAL, T3FREE, THYROIDAB in the last 72 hours.  Invalid input(s): FREET3  Cardiac Enzymes No results for input(s): CKMB, TROPONINI, MYOGLOBIN in the last 168 hours.  Invalid input(s): CK ------------------------------------------------------------------------------------------------------------------    Component Value Date/Time   BNP 197.8 (H) 06/01/2020 1913    Micro Results Recent Results (from the past 240 hour(s))  Resp Panel by RT-PCR (Flu A&B, Covid) Nasopharyngeal Swab     Status: Abnormal   Collection Time: 06/01/20  3:35 PM   Specimen: Nasopharyngeal Swab; Nasopharyngeal(NP) swabs in vial transport medium  Result Value Ref Range Status   SARS Coronavirus 2 by RT PCR POSITIVE (A) NEGATIVE Final    Comment: RESULT CALLED TO, READ BACK BY AND VERIFIED WITH: (NOTE) SARS-CoV-2 target nucleic acids are DETECTED.  The SARS-CoV-2 RNA is generally detectable in upper respiratory specimens during the acute phase of infection. Positive results are indicative of the presence of the identified virus, but do not rule out bacterial infection or co-infection with other pathogens not detected by the test. Clinical correlation with patient history and other diagnostic information is necessary to determine patient infection status. The expected result is Negative.  Fact Sheet for Patients: EntrepreneurPulse.com.au  Fact Sheet for Healthcare  Providers: IncredibleEmployment.be  This test is not yet approved or cleared by the [place]  States FDA and  has been authorized for detection and/or diagnosis of SARS-CoV-2 by FDA under an Emergency Use Authorization (EUA).  This EUA will remain in effect (meaning this test can be used) for the duration of  the [place] VID-19 declaration under Section 564(b)(1) of the Act, 21 U.S.C. section 360bbb-3(b)(1), unless the authorization is terminated or revoked sooner.     Influenza A by PCR NEGATIVE NEGATIVE Final    Comment: A,OLEARY @1735  06/01/20 EB   Influenza B by PCR NEGATIVE NEGATIVE Final    Comment: (NOTE) The Xpert Xpress SARS-CoV-2/FLU/RSV plus assay is intended as an aid in the diagnosis of influenza from Nasopharyngeal swab specimens and should not be used as a sole basis for treatment. Nasal washings and aspirates are unacceptable for Xpert Xpress SARS-CoV-2/FLU/RSV testing.  Fact Sheet for Patients: EntrepreneurPulse.com.au  Fact Sheet for Healthcare Providers: IncredibleEmployment.be  This test is not yet approved or cleared by the [place] FDA and has been authorized for detection and/or diagnosis of SARS-CoV-2 by FDA under an Emergency Use Authorization (EUA). This EUA will remain in effect (meaning this test can be used) for the duration of the COVID-19 declaration under Section 564(b)(1) of the Act, 21 U.S.C. section 360bbb-3(b)(1), unless the authorization is terminated or revoked.  Performed at [facility], [place] [address] [place], [place] [address]   Blood culture (routine x 2)     Status: None (Preliminary result)   Collection Time: 06/01/20  4:00 PM   Specimen: BLOOD RIGHT FOREARM  Result Value Ref Range Status   Specimen Description BLOOD RIGHT FOREARM  Final   Special Requests   Final    BOTTLES DRAWN AEROBIC AND ANAEROBIC Blood Culture adequate volume   Culture  Setup Time   Final    GRAM  POSITIVE COCCI IN CLUSTERS IN BOTH AEROBIC AND ANAEROBIC BOTTLES Organism ID to follow CRITICAL RESULT CALLED TO, READ BACK BY AND VERIFIED WITHSalli Real, AT 0603 06/02/20 [place] Performed at [facility], [place] [address], [place], [place] [address]    Culture GRAM POSITIVE COCCI  Final   Report Status PENDING  Incomplete  Blood Culture ID Panel (Reflexed)     Status: Abnormal   Collection Time: 06/01/20  4:00 PM  Result Value Ref Range Status   Enterococcus faecalis NOT DETECTED NOT DETECTED Final   Enterococcus Faecium NOT DETECTED NOT DETECTED Final   Listeria monocytogenes NOT DETECTED NOT DETECTED Final   Staphylococcus species DETECTED (A) NOT DETECTED Final    Comment: CRITICAL RESULT CALLED TO, READ BACK BY AND VERIFIED WITH: G. ABBOTT PHARMD, AT 0603 06/02/20 D. VANHOOK    Staphylococcus aureus (BCID) DETECTED (A) NOT DETECTED Final    Comment: CRITICAL RESULT CALLED TO, READ BACK BY AND VERIFIED WITH: G. ABBOTT PHARMD, AT 0603 06/02/20 D. VANHOOK    Staphylococcus epidermidis NOT DETECTED NOT DETECTED Final   Staphylococcus lugdunensis NOT DETECTED NOT DETECTED Final   Streptococcus species NOT DETECTED NOT DETECTED Final   Streptococcus agalactiae NOT DETECTED NOT DETECTED Final   Streptococcus pneumoniae NOT DETECTED NOT DETECTED Final   Streptococcus pyogenes NOT DETECTED NOT DETECTED Final   A.calcoaceticus-baumannii NOT DETECTED NOT DETECTED Final   Bacteroides fragilis NOT DETECTED NOT DETECTED Final   Enterobacterales NOT DETECTED NOT DETECTED Final   Enterobacter cloacae complex NOT DETECTED NOT DETECTED Final   Escherichia coli NOT DETECTED NOT DETECTED Final   Klebsiella aerogenes NOT DETECTED NOT DETECTED Final   Klebsiella oxytoca NOT DETECTED  NOT DETECTED Final   Klebsiella pneumoniae NOT DETECTED NOT DETECTED Final   Proteus species NOT DETECTED NOT DETECTED Final   Salmonella species NOT DETECTED NOT DETECTED Final   Serratia marcescens  NOT DETECTED NOT DETECTED Final   Haemophilus influenzae NOT DETECTED NOT DETECTED Final   Neisseria meningitidis NOT DETECTED NOT DETECTED Final   Pseudomonas aeruginosa NOT DETECTED NOT DETECTED Final   Stenotrophomonas maltophilia NOT DETECTED NOT DETECTED Final   Candida albicans NOT DETECTED NOT DETECTED Final   Candida auris NOT DETECTED NOT DETECTED Final   Candida glabrata NOT DETECTED NOT DETECTED Final   Candida krusei NOT DETECTED NOT DETECTED Final   Candida parapsilosis NOT DETECTED NOT DETECTED Final   Candida tropicalis NOT DETECTED NOT DETECTED Final   Cryptococcus neoformans/gattii NOT DETECTED NOT DETECTED Final   Meth resistant mecA/C and MREJ NOT DETECTED NOT DETECTED Final    Comment: Performed at [facility], [place] [address], [place], [place] [address]  Blood culture (routine x 2)     Status: None (Preliminary result)   Collection Time: 06/01/20  4:02 PM   Specimen: BLOOD LEFT FOREARM  Result Value Ref Range Status   Specimen Description BLOOD LEFT FOREARM  Final   Special Requests   Final    BOTTLES DRAWN AEROBIC AND ANAEROBIC Blood Culture adequate volume   Culture  Setup Time   Final    GRAM POSITIVE COCCI IN CLUSTERS IN BOTH AEROBIC AND ANAEROBIC BOTTLES Performed at [facility], [place] [address] [place], [place] [address]    Culture GRAM POSITIVE COCCI  Final   Report Status PENDING  Incomplete    Radiology Reports DG Chest 2 View  Result Date: 06/01/2020 CLINICAL DATA:  Shortness of breath EXAM: CHEST - 2 VIEW COMPARISON:  06/01/2020 prior studies FINDINGS: The cardiomediastinal silhouette is unremarkable. Minimal pulmonary vascular congestion present. There is no evidence of focal airspace disease, pulmonary edema, suspicious pulmonary nodule/mass, pleural effusion, or pneumothorax. No acute bony abnormalities are identified. IMPRESSION: Minimal pulmonary vascular congestion. Electronically Signed   By: Margarette Canada M.D.   On: 06/01/2020 14:59    DG Chest 2 View  Result Date: 06/01/2020 CLINICAL DATA:  Cough and shortness of breath EXAM: CHEST - 2 VIEW COMPARISON:  05/29/20 FINDINGS: The heart size and mediastinal contours are within normal limits. Both lungs are clear. Increased pulmonary vascular congestion without overt edema. No pleural effusion. The visualized skeletal structures are unremarkable. IMPRESSION: Increased pulmonary vascular congestion without overt edema. Electronically Signed   By: Kerby Moors M.D.   On: 06/01/2020 13:12   DG Chest 2 View  Result Date: 05/29/2020 CLINICAL DATA:  Chest pain and shortness of breath beginning yesterday. Intermittent hemoptysis. EXAM: CHEST - 2 VIEW COMPARISON:  01/04/2015 FINDINGS: The heart size and mediastinal contours are within normal limits. Both lungs are clear. The visualized skeletal structures are unremarkable. IMPRESSION: No active cardiopulmonary disease. Electronically Signed   By: Marlaine Hind M.D.   On: 05/29/2020 13:48   CT Angio Chest PE W/Cm &/Or Wo Cm  Result Date: 05/29/2020 CLINICAL DATA:  Chest pain. EXAM: CT ANGIOGRAPHY CHEST WITH CONTRAST TECHNIQUE: Multidetector CT imaging of the chest was performed using the standard protocol during bolus administration of intravenous contrast. Multiplanar CT image reconstructions and MIPs were obtained to evaluate the vascular anatomy. CONTRAST:  196mL OMNIPAQUE IOHEXOL 350 MG/ML SOLN COMPARISON:  January 04, 2015. FINDINGS: Cardiovascular: Satisfactory opacification of the pulmonary arteries to the segmental level. No evidence of  pulmonary embolism. Normal heart size. No pericardial effusion. Mediastinum/Nodes: No enlarged mediastinal, hilar, or axillary lymph nodes. Thyroid gland, trachea, and esophagus demonstrate no significant findings. Lungs/Pleura: No pneumothorax or pleural effusion is noted. 5 mm nodule is noted in left lower lobe best seen on image number 53 of series 5. 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 Mild  splenomegaly is noted. Musculoskeletal: No chest wall abnormality. No acute or significant osseous findings. Review of the MIP images confirms the above findings. IMPRESSION: 1. No definite evidence of pulmonary embolus. 2. 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 Mild splenomegaly is noted. 3. 5 mm left lower lobe nodule is noted. No follow-up needed if patient is low-risk. Non-contrast chest CT can be considered in 12 months if patient is high-risk. This recommendation follows the consensus statement: Guidelines for Management of Incidental Pulmonary Nodules Detected on CT Images: From the Fleischner Society 2017; Radiology 2017; 284:228-243. Electronically Signed   By: Marijo Conception M.D.   On: 05/29/2020 15:52   US Venous Img Lower Unilateral Left  Result Date: 05/29/2020 CLINICAL DATA:  Left leg pain EXAM: LEFT LOWER EXTREMITY VENOUS DOPPLER ULTRASOUND TECHNIQUE: Gray-scale sonography with graded compression, as well as color Doppler and duplex ultrasound were performed to evaluate the lower extremity deep venous systems from the level of the common femoral vein and including the common femoral, femoral, profunda femoral, popliteal and calf veins including the posterior tibial, peroneal and gastrocnemius veins when visible. The superficial great saphenous vein was also interrogated. Spectral Doppler was utilized to evaluate flow at rest and with distal augmentation maneuvers in the common femoral, femoral and popliteal veins. COMPARISON:  None. FINDINGS: Contralateral Common Femoral Vein: Respiratory phasicity is normal and symmetric with the symptomatic side. No evidence of thrombus. Normal compressibility. Common Femoral Vein: No evidence of thrombus. Normal compressibility, respiratory phasicity and response to augmentation. Saphenofemoral Junction: No evidence of thrombus. Normal compressibility and flow on color Doppler imaging. Profunda Femoral Vein: No evidence of thrombus. Normal compressibility and flow  on color Doppler imaging. Femoral Vein: No evidence of thrombus. Normal compressibility, respiratory phasicity and response to augmentation. Popliteal Vein: No evidence of thrombus. Normal compressibility, respiratory phasicity and response to augmentation. Calf Veins: No evidence of thrombus. Normal compressibility and flow on color Doppler imaging. Superficial Great Saphenous Vein: No evidence of thrombus. Normal compressibility. Venous Reflux:  None. Other Findings:  None. IMPRESSION: No evidence of deep venous thrombosis. Electronically Signed   By: Inez Catalina M.D.   On: 05/29/2020 15:57   DG Chest Port 1 View  Result Date: 06/02/2020 CLINICAL DATA:  60 year old male with shortness of breath, history of COVID 19. EXAM: PORTABLE CHEST - 1 VIEW COMPARISON:  06/01/2020 FINDINGS: The mediastinal contours are within normal limits. No cardiomegaly. The lungs are clear bilaterally without evidence of focal consolidation, pleural effusion, or pneumothorax. No acute osseous abnormality. IMPRESSION: No acute cardiopulmonary process. Electronically Signed   By: Ruthann Cancer MD   On: 06/02/2020 08:47

## 2020-06-02 NOTE — Progress Notes (Signed)
Educated patient on self-injecting insulin. Patient injected insulin into his stomach during lunch and dinner coverage times. Patient demonstrated proper technique and the importance of not reusing needles, disposing of needles in a safe manner, and rotating injection sites. Diabetes book and starter kit given to patient. Unit practice insulin pen and needles used to show patient how to use the insulin pen. Patient described the importance of priming the pen before use and always using a clean needle each time. Patient was allowed time to practice with unit insulin pen in his room.

## 2020-06-02 NOTE — Progress Notes (Signed)
Upon arrival to the unit, patient's BG 424, Dr. Myna Hidalgo notified via secure chat and immediately provided a new order for novolog 8 units SQ, BG rechecked within 2-3 hours per MD's order, 431 mg/dl: MD notified with a new order for 10units of insulin and AIC check this AM.

## 2020-06-02 NOTE — Progress Notes (Addendum)
Inpatient Diabetes Program Recommendations  AACE/ADA: New Consensus Statement on Inpatient Glycemic Control (2015)  Target Ranges:  Prepandial:   less than 140 mg/dL      Peak postprandial:   less than 180 mg/dL (1-2 hours)      Critically ill patients:  140 - 180 mg/dL   Lab Results  Component Value Date   GLUCAP 410 (H) 06/02/2020   HGBA1C 10.9 (H) 06/02/2020    Review of Glycemic Control Results for Travis Hampton, Travis Hampton (MRN [id number]) as of 06/02/2020 11:55  Ref. Range 05/29/2020 16:06 05/29/2020 16:59 05/29/2020 18:26 06/01/2020 22:35 06/02/2020 01:48 06/02/2020 08:22  Glucose-Capillary Latest Ref Range: 70 - 99 mg/dL 386 (H) 402 (H) 326 (H) 424 (H) 431 (H) 410 (H)   Diabetes history: DM 2 Outpatient Diabetes medications:  Metformin 500 mg bid Current orders for Inpatient glycemic control:  Lantus 25 units daily Novolog resistant tid with meals and HS Novolog 4 units tid with meals Solumedrol 60 mg IV q 12 hours  Inpatient Diabetes Program Recommendations:    Agree with current orders. It appears that patient was recently diagnosed with DM in the past week and was started on metformin. Called and spoke with him by phone. Spoke with pt about new diagnosis.  Discussed A1C results with him and explained what an A1C is, basic pathophysiology of DM Type 2, basic home care, importance of checking CBGs and maintaining good CBG control to prevent long-term and short-term complications.  Reviewed signs and symptoms of hyperglycemia and hypoglycemia.  RNs to provide ongoing basic DM education at bedside with this patient.  Have ordered educational booklet, insulin starter kit, and DM videos.h  Will send him link for insulin pen video/teaching.  He plans to f/u with Dr. Forde Dandy regarding DM management. Explained to patient that he will need insulin initially however PCP may be able to switch to GLP-1, etc. Once steroids stopped. We discussed need to avoid sugar in beverages and limit CHO intake  At  d/c he will need Glucose meter, Lantus solostar pen, pen needles, and Novolog (if needed).    Patient appreciative of information.  Thanks Adah Perl, RN, BC-ADM Inpatient Diabetes Coordinator Pager [phone] (8a-5p)

## 2020-06-03 ENCOUNTER — Inpatient Hospital Stay (HOSPITAL_COMMUNITY): Payer: 59

## 2020-06-03 DIAGNOSIS — R7881 Bacteremia: Secondary | ICD-10-CM

## 2020-06-03 DIAGNOSIS — U071 COVID-19: Secondary | ICD-10-CM | POA: Diagnosis not present

## 2020-06-03 DIAGNOSIS — R509 Fever, unspecified: Secondary | ICD-10-CM | POA: Diagnosis not present

## 2020-06-03 DIAGNOSIS — B9561 Methicillin susceptible Staphylococcus aureus infection as the cause of diseases classified elsewhere: Secondary | ICD-10-CM | POA: Diagnosis not present

## 2020-06-03 LAB — ECHOCARDIOGRAM COMPLETE
Height: 74 in
Weight: 4182.74 oz

## 2020-06-03 LAB — COMPREHENSIVE METABOLIC PANEL
ALT: 24 U/L (ref 0–44)
AST: 32 U/L (ref 15–41)
Albumin: 2.2 g/dL — ABNORMAL LOW (ref 3.5–5.0)
Alkaline Phosphatase: 93 U/L (ref 38–126)
Anion gap: 9 (ref 5–15)
BUN: 25 mg/dL — ABNORMAL HIGH (ref 8–23)
CO2: 25 mmol/L (ref 22–32)
Calcium: 8 mg/dL — ABNORMAL LOW (ref 8.9–10.3)
Chloride: 98 mmol/L (ref 98–111)
Creatinine, Ser: 0.79 mg/dL (ref 0.61–1.24)
GFR, Estimated: >60 mL/min (ref >60)
Glucose, Bld: 312 mg/dL — ABNORMAL HIGH (ref 70–99)
Potassium: 4.4 mmol/L (ref 3.5–5.1)
Sodium: 132 mmol/L — ABNORMAL LOW (ref 135–145)
Total Bilirubin: 1.2 mg/dL (ref 0.3–1.2)
Total Protein: 5.9 g/dL — ABNORMAL LOW (ref 6.5–8.1)

## 2020-06-03 LAB — CBC WITH DIFFERENTIAL/PLATELET
Abs Immature Granulocytes: 0.04 10*3/uL (ref 0.00–0.07)
Basophils Absolute: 0 10*3/uL (ref 0.0–0.1)
Basophils Relative: 0 %
Eosinophils Absolute: 0 10*3/uL (ref 0.0–0.5)
Eosinophils Relative: 0 %
HCT: 32 % — ABNORMAL LOW (ref 39.0–52.0)
Hemoglobin: 10.9 g/dL — ABNORMAL LOW (ref 13.0–17.0)
Immature Granulocytes: 0 %
Lymphocytes Relative: 7 %
Lymphs Abs: 0.6 10*3/uL — ABNORMAL LOW (ref 0.7–4.0)
MCH: 34.4 pg — ABNORMAL HIGH (ref 26.0–34.0)
MCHC: 34.1 g/dL (ref 30.0–36.0)
MCV: 100.9 fL — ABNORMAL HIGH (ref 80.0–100.0)
Monocytes Absolute: 0.3 10*3/uL (ref 0.1–1.0)
Monocytes Relative: 4 %
Neutro Abs: 8.4 10*3/uL — ABNORMAL HIGH (ref 1.7–7.7)
Neutrophils Relative %: 89 %
Platelets: 132 10*3/uL — ABNORMAL LOW (ref 150–400)
RBC: 3.17 MIL/uL — ABNORMAL LOW (ref 4.22–5.81)
RDW: 12.7 % (ref 11.5–15.5)
WBC: 9.4 10*3/uL (ref 4.0–10.5)
nRBC: 0 % (ref 0.0–0.2)

## 2020-06-03 LAB — GLUCOSE, CAPILLARY
Glucose-Capillary: 367 mg/dL — ABNORMAL HIGH (ref 70–99)
Glucose-Capillary: 241 mg/dL — ABNORMAL HIGH (ref 70–99)
Glucose-Capillary: 260 mg/dL — ABNORMAL HIGH (ref 70–99)
Glucose-Capillary: 236 mg/dL — ABNORMAL HIGH (ref 70–99)

## 2020-06-03 LAB — PROCALCITONIN: Procalcitonin: 0.5 ng/mL

## 2020-06-03 LAB — D-DIMER, QUANTITATIVE: D-Dimer, Quant: 1.57 ug/mL-FEU — ABNORMAL HIGH (ref 0.00–0.50)

## 2020-06-03 LAB — C-REACTIVE PROTEIN: CRP: 13.8 mg/dL — ABNORMAL HIGH (ref <1.0)

## 2020-06-03 LAB — BRAIN NATRIURETIC PEPTIDE: B Natriuretic Peptide: 172.5 pg/mL — ABNORMAL HIGH (ref 0.0–100.0)

## 2020-06-03 LAB — MAGNESIUM: Magnesium: 1.9 mg/dL (ref 1.7–2.4)

## 2020-06-03 MED ORDER — METHYLPREDNISOLONE SODIUM SUCC 40 MG IJ SOLR
40.0000 mg | INTRAMUSCULAR | Status: DC
  Administered 2020-06-04: 40 mg via INTRAVENOUS
  Filled 2020-06-03: qty 1

## 2020-06-03 NOTE — Progress Notes (Signed)
Nutrition Education Note  RD consulted for nutrition education regarding new onset diabetes.   Lab Results  Component Value Date   HGBA1C 10.9 (H) 06/02/2020    RD provided "Carbohydrate Counting for People with Diabetes" handout from the Academy of Nutrition and Dietetics. Discussed different food groups and their effects on blood sugar, emphasizing carbohydrate-containing foods. Provided list of carbohydrates and recommended serving sizes of common foods.  Discussed importance of controlled and consistent carbohydrate intake throughout the day. Provided examples of ways to balance meals/snacks and encouraged intake of high-fiber, whole grain complex carbohydrates. Teach back method used.  Expect good compliance. Patient states he is ready to make changes to his diet to improve his health.   Body mass index is 33.56 kg/m. Pt meets criteria for overweight based on current BMI.  Current diet order is heart healthy CHO modified, patient is consuming approximately 100% of meals at this time. Labs and medications reviewed. No further nutrition interventions warranted at this time. RD contact information provided. If additional nutrition issues arise, please re-consult RD.  Lucas Mallow, RD, LDN, CNSC Please refer to [facility] for contact information.

## 2020-06-03 NOTE — Progress Notes (Signed)
*  PRELIMINARY RESULTS* Echocardiogram 2D Echocardiogram has been performed.  Travis Hampton 06/03/2020, 4:10 PM

## 2020-06-03 NOTE — Progress Notes (Signed)
[facility]   Reason for visit: Follow up on Staph aureus bacteremia  Interval History: feels well, WBC wnl, remains afebrile over 24 hours.  No sob.   Day 3 total antibiotics Day 3 cefazolin  Physical Exam: Constitutional:  Vitals:   06/02/20 2355 06/03/20 0515  BP: 105/74 107/66  Pulse: 82 80  Resp: 20 20  Temp: 98.1 F (36.7 C) 98.4 F (36.9 C)  SpO2: 93% 94%   patient appears in NAD, up in chair Respiratory: Normal respiratory effort; CTA B Cardiovascular: RRR GI: soft, nt, nd  Review of Systems: Constitutional: negative for fevers and chills Gastrointestinal: negative for nausea and diarrhea  Lab Results  Component Value Date   WBC 9.4 06/03/2020   HGB 10.9 (L) 06/03/2020   HCT 32.0 (L) 06/03/2020   MCV 100.9 (H) 06/03/2020   PLT 132 (L) 06/03/2020    Lab Results  Component Value Date   CREATININE 0.79 06/03/2020   BUN 25 (H) 06/03/2020   NA 132 (L) 06/03/2020   K 4.4 06/03/2020   CL 98 06/03/2020   CO2 25 06/03/2020    Lab Results  Component Value Date   ALT 24 06/03/2020   AST 32 06/03/2020   ALKPHOS 93 06/03/2020     Microbiology: Recent Results (from the past 240 hour(s))  Covid-19, Flu A+B ([facility])     Status: None   Collection Time: 06/01/20 12:54 PM   Specimen: Nasopharyngeal   Naso  Result Value Ref Range Status   SARS-CoV-2, NAA Not Detected Not Detected Final    Comment: This nucleic acid amplification test was developed and its performance characteristics determined by [employer]. Nucleic acid amplification tests include RT-PCR and TMA. This test has not been FDA cleared or approved. This test has been authorized by FDA under an Emergency Use Authorization (EUA). This test is only authorized for the duration of time the declaration that circumstances exist justifying the authorization of the emergency use of in vitro diagnostic tests for detection of SARS-CoV-2 virus and/or diagnosis of  COVID-19 infection under section 564(b)(1) of the Act, 21 U.S.C. 297LGX-2(J) (1), unless the authorization is terminated or revoked sooner. When diagnostic testing is negative, the possibility of a false negative result should be considered in the context of a patient's recent exposures and the presence of clinical signs and symptoms consistent with COVID-19. An individual without symptoms of COVID-19 and who is not shedding SARS-CoV-2 virus wo uld expect to have a negative (not detected) result in this assay.    Influenza A, NAA Not Detected Not Detected Final   Influenza B, NAA Not Detected Not Detected Final  Resp Panel by RT-PCR (Flu A&B, Covid) Nasopharyngeal Swab     Status: Abnormal   Collection Time: 06/01/20  3:35 PM   Specimen: Nasopharyngeal Swab; Nasopharyngeal(NP) swabs in vial transport medium  Result Value Ref Range Status   SARS Coronavirus 2 by RT PCR POSITIVE (A) NEGATIVE Final    Comment: RESULT CALLED TO, READ BACK BY AND VERIFIED WITH: (NOTE) SARS-CoV-2 target nucleic acids are DETECTED.  The SARS-CoV-2 RNA is generally detectable in upper respiratory specimens during the acute phase of infection. Positive results are indicative of the presence of the identified virus, but do not rule out bacterial infection or co-infection with other pathogens not detected by the test. Clinical correlation with patient history and other diagnostic information is necessary to determine patient infection status. The expected result is Negative.  Fact Sheet for Patients:  EntrepreneurPulse.com.au  Fact Sheet for Healthcare Providers: IncredibleEmployment.be  This test is not yet approved or cleared by the [place] FDA and  has been authorized for detection and/or diagnosis of SARS-CoV-2 by FDA under an Emergency Use Authorization (EUA).  This EUA will remain in effect (meaning this test can be used) for the duration of  the [place] VID-19  declaration under Section 564(b)(1) of the Act, 21 U.S.C. section 360bbb-3(b)(1), unless the authorization is terminated or revoked sooner.     Influenza A by PCR NEGATIVE NEGATIVE Final    Comment: A,OLEARY @1735  06/01/20 EB   Influenza B by PCR NEGATIVE NEGATIVE Final    Comment: (NOTE) The Xpert Xpress SARS-CoV-2/FLU/RSV plus assay is intended as an aid in the diagnosis of influenza from Nasopharyngeal swab specimens and should not be used as a sole basis for treatment. Nasal washings and aspirates are unacceptable for Xpert Xpress SARS-CoV-2/FLU/RSV testing.  Fact Sheet for Patients: EntrepreneurPulse.com.au  Fact Sheet for Healthcare Providers: IncredibleEmployment.be  This test is not yet approved or cleared by the [place] FDA and has been authorized for detection and/or diagnosis of SARS-CoV-2 by FDA under an Emergency Use Authorization (EUA). This EUA will remain in effect (meaning this test can be used) for the duration of the COVID-19 declaration under Section 564(b)(1) of the Act, 21 U.S.C. section 360bbb-3(b)(1), unless the authorization is terminated or revoked.  Performed at [facility], [place] [address] [place], [place] [address]   Blood culture (routine x 2)     Status: Abnormal (Preliminary result)   Collection Time: 06/01/20  4:00 PM   Specimen: BLOOD RIGHT FOREARM  Result Value Ref Range Status   Specimen Description BLOOD RIGHT FOREARM  Final   Special Requests   Final    BOTTLES DRAWN AEROBIC AND ANAEROBIC Blood Culture adequate volume   Culture  Setup Time   Final    GRAM POSITIVE COCCI IN CLUSTERS IN BOTH AEROBIC AND ANAEROBIC BOTTLES Organism ID to follow CRITICAL RESULT CALLED TO, READ BACK BY AND VERIFIED WITH: Denton Brick PHARMD, AT 0603 06/02/20 D. VANHOOK    Culture (A)  Final    STAPHYLOCOCCUS AUREUS SUSCEPTIBILITIES TO FOLLOW Performed at [facility], [place] [address], [place], [place]  [address]    Report Status PENDING  Incomplete  Blood Culture ID Panel (Reflexed)     Status: Abnormal   Collection Time: 06/01/20  4:00 PM  Result Value Ref Range Status   Enterococcus faecalis NOT DETECTED NOT DETECTED Final   Enterococcus Faecium NOT DETECTED NOT DETECTED Final   Listeria monocytogenes NOT DETECTED NOT DETECTED Final   Staphylococcus species DETECTED (A) NOT DETECTED Final    Comment: CRITICAL RESULT CALLED TO, READ BACK BY AND VERIFIED WITH: G. ABBOTT PHARMD, AT 0603 06/02/20 D. VANHOOK    Staphylococcus aureus (BCID) DETECTED (A) NOT DETECTED Final    Comment: CRITICAL RESULT CALLED TO, READ BACK BY AND VERIFIED WITH: G. ABBOTT PHARMD, AT 0603 06/02/20 D. VANHOOK    Staphylococcus epidermidis NOT DETECTED NOT DETECTED Final   Staphylococcus lugdunensis NOT DETECTED NOT DETECTED Final   Streptococcus species NOT DETECTED NOT DETECTED Final   Streptococcus agalactiae NOT DETECTED NOT DETECTED Final   Streptococcus pneumoniae NOT DETECTED NOT DETECTED Final   Streptococcus pyogenes NOT DETECTED NOT DETECTED Final   A.calcoaceticus-baumannii NOT DETECTED NOT DETECTED Final   Bacteroides fragilis NOT DETECTED NOT DETECTED Final   Enterobacterales NOT DETECTED NOT DETECTED Final   Enterobacter cloacae complex NOT  DETECTED NOT DETECTED Final   Escherichia coli NOT DETECTED NOT DETECTED Final   Klebsiella aerogenes NOT DETECTED NOT DETECTED Final   Klebsiella oxytoca NOT DETECTED NOT DETECTED Final   Klebsiella pneumoniae NOT DETECTED NOT DETECTED Final   Proteus species NOT DETECTED NOT DETECTED Final   Salmonella species NOT DETECTED NOT DETECTED Final   Serratia marcescens NOT DETECTED NOT DETECTED Final   Haemophilus influenzae NOT DETECTED NOT DETECTED Final   Neisseria meningitidis NOT DETECTED NOT DETECTED Final   Pseudomonas aeruginosa NOT DETECTED NOT DETECTED Final   Stenotrophomonas maltophilia NOT DETECTED NOT DETECTED Final   Candida albicans NOT DETECTED  NOT DETECTED Final   Candida auris NOT DETECTED NOT DETECTED Final   Candida glabrata NOT DETECTED NOT DETECTED Final   Candida krusei NOT DETECTED NOT DETECTED Final   Candida parapsilosis NOT DETECTED NOT DETECTED Final   Candida tropicalis NOT DETECTED NOT DETECTED Final   Cryptococcus neoformans/gattii NOT DETECTED NOT DETECTED Final   Meth resistant mecA/C and MREJ NOT DETECTED NOT DETECTED Final    Comment: Performed at [facility], [place] [address], [place], [place] [address]  Blood culture (routine x 2)     Status: Abnormal (Preliminary result)   Collection Time: 06/01/20  4:02 PM   Specimen: BLOOD LEFT FOREARM  Result Value Ref Range Status   Specimen Description BLOOD LEFT FOREARM  Final   Special Requests   Final    BOTTLES DRAWN AEROBIC AND ANAEROBIC Blood Culture adequate volume   Culture  Setup Time   Final    GRAM POSITIVE COCCI IN CLUSTERS IN BOTH AEROBIC AND ANAEROBIC BOTTLES Performed at [facility], [place] [address] [place], [place] [address]    Culture STAPHYLOCOCCUS AUREUS (A)  Final   Report Status PENDING  Incomplete    Impression/Plan:  1. MSSA bacteremia - repeat blood cultures sent, TTE ordered and no new issues.  No other clinical concerns for source with no back pain or skin findings. Clinically feeling better and will continue with cefazolin.  Duration depending on TTE or TEE   2.  CoVID -19 positive - largely asymptomatic and on treatment per the primary team.    3.  DM - new diagnosis for him and getting appropriate diabetic education.

## 2020-06-03 NOTE — Progress Notes (Addendum)
PROGRESS NOTE                                                                                                                                                                                                             Patient Demographics:    Travis Hampton, is a 60 y.o. male, DOB - Jul 29, 1960, HTD:[id number]  Outpatient Primary MD for the patient is Reynold Bowen, MD    LOS - 2  Admit date - 06/01/2020    Chief Complaint  Patient presents with  . Shortness of Breath       Brief Narrative (HPI from H&P) - Travis Hampton is a 60 y.o. 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   Subjective:   Patient in bed, appears comfortable, denies any headache, no fever, no chest pain or pressure, no shortness of breath , no abdominal pain. No focal weakness.   Assessment  & Plan :     1. Acute Hypoxic Resp. Failure due to Acute Covid 19 Viral Pneumonitis during the ongoing 2020 Covid 19 Pandemic - he is partially vaccinated with 2 mRNA shots, he has been started on IV steroids & Remdesivir. Encouraged the patient to sit up in chair in the daytime use I-S and flutter valve for pulmonary toiletry.  Will advance activity and titrate down oxygen as possible.  Clinically much better start tapering steroids.   SpO2: 94 %  Recent Labs  Lab 05/29/20 1330 05/29/20 1500 06/01/20 1432 06/01/20 1535 06/01/20 1654 06/01/20 1913 06/02/20 0246 06/02/20 1022 06/02/20 1023 06/03/20 0143  WBC 8.9  --  10.2  --   --   --  7.5  --   --  9.4  HGB 13.4 12.2* 12.7*  --   --   --  11.0*  --   --  10.9*  HCT 37.8* 36.0* 38.3*  --   --   --  32.5*  --   --  32.0*  PLT 120*  --  154  --   --   --  106*  --   --  132*  CRP  --   --   --   --   --  18.6*  17.6*  --    --  13.8*  BNP  --   --  190.2*  --   --  197.8*  --  4,243.4*  --  172.5*  DDIMER  --   --   --   --   --  2.15* 1.94*  --   --  1.57*  PROCALCITON  --   --   --   --   --  0.92  --   --  0.70 0.50  AST  --   --   --   --   --  39 30  --   --  32  ALT  --   --   --   --   --  26 25  --   --  24  ALKPHOS  --   --   --   --   --  100 90  --   --  93  BILITOT  --   --   --   --   --  2.4* 1.5*  --   --  1.2  ALBUMIN  --   --   --   --   --  2.6* 2.3*  --   --  2.2*  INR  --   --   --   --   --  1.5*  --   --   --   --   LATICACIDVEN  --   --   --   --  2.1* 2.9*  --   --   --   --   SARSCOV2NAA  --   --   --  POSITIVE*  --   --   --   --   --   --       2. Possible mild bacterial bronchitis, 2/2 Staph. Aureus blood culture positive.- pro calcitonin was borderline, ID following currently on empiric IV antibiotics may require PICC line, echo pending.  Repeat cultures drawn on 06/03/2020. ? Source.  3.  Dyslipidemia.  On statin continue.  4.  Elevated lactic acid, hydrate, hold Glucophage.    5. DM 2 -poor outpatient control due to hyperglycemia: Placed on Lantus along with sliding scale, DM and insulin education while he is here.  Steroids being rapidly tapered, monitor closely.  Lab Results  Component Value Date   HGBA1C 10.9 (H) 06/02/2020   CBG (last 3)  Recent Labs    06/02/20 1750 06/02/20 2118 06/03/20 0809  GLUCAP 251* 251* 367*         Condition - Fair  Family Communication  : Sherryll Burger [phone] 06/02/20  Code Status :  Full  Consults  :  None  PUD Prophylaxis : None   Procedures  :     TTE  PICC        Disposition Plan  :    Status is: Inpatient  Remains inpatient appropriate because:IV treatments appropriate due to intensity of illness or inability to take PO   Dispo: The patient is from: Home              Anticipated d/c is to: Home              Patient currently is not medically stable to d/c.   Difficult to place patient No  DVT  Prophylaxis  :  Lovenox    Lab Results  Component Value Date   PLT 132 (L) 06/03/2020    Diet :  Diet Order  Diet heart healthy/carb modified Room service appropriate? Yes; Fluid consistency: Thin  Diet effective now                  Inpatient Medications  Scheduled Meds: . atorvastatin  20 mg Oral Daily  . enoxaparin (LOVENOX) injection  40 mg Subcutaneous Q24H  . insulin aspart  0-20 Units Subcutaneous TID WC  . insulin aspart  0-5 Units Subcutaneous QHS  . insulin aspart  4 Units Subcutaneous TID WC  . insulin glargine  25 Units Subcutaneous Daily  . [START ON 06/04/2020] methylPREDNISolone (SOLU-MEDROL) injection  40 mg Intravenous Q24H   Continuous Infusions: .  ceFAZolin (ANCEF) IV 2 g (06/03/20 0510)  . remdesivir 100 mg in NS 100 mL 100 mg (06/03/20 0833)   PRN Meds:.acetaminophen, guaiFENesin-dextromethorphan, Ipratropium-Albuterol, senna-docusate  Antibiotics  :    Anti-infectives (From admission, onward)   Start     Dose/Rate Route Frequency Ordered Stop   06/02/20 1745  ceFAZolin (ANCEF) IVPB 2g/100 mL premix        2 g 200 mL/hr over 30 Minutes Intravenous Every 8 hours 06/02/20 1710     06/02/20 1145  levofloxacin (LEVAQUIN) tablet 500 mg  Status:  Discontinued        500 mg Oral Daily 06/02/20 1048 06/02/20 1713   06/02/20 1000  remdesivir 100 mg in sodium chloride 0.9 % 100 mL IVPB       "Followed by" Linked Group Details   100 mg 200 mL/hr over 30 Minutes Intravenous Daily 06/01/20 1816 06/06/20 0959   06/02/20 0645  ceFAZolin (ANCEF) IVPB 2g/100 mL premix  Status:  Discontinued        2 g 200 mL/hr over 30 Minutes Intravenous Every 8 hours 06/02/20 0613 06/02/20 1044   06/01/20 1815  remdesivir 200 mg in sodium chloride 0.9% 250 mL IVPB       "Followed by" Linked Group Details   200 mg 580 mL/hr over 30 Minutes Intravenous Once 06/01/20 1816 06/01/20 2013   06/01/20 1515  cefTRIAXone (ROCEPHIN) 1 g in sodium chloride 0.9 % 100 mL IVPB         1 g 200 mL/hr over 30 Minutes Intravenous  Once 06/01/20 1508 06/01/20 1642   06/01/20 1515  azithromycin (ZITHROMAX) 500 mg in sodium chloride 0.9 % 250 mL IVPB        500 mg 250 mL/hr over 60 Minutes Intravenous  Once 06/01/20 1508 06/01/20 1757       Time Spent in minutes  30   Lala Lund M.D on 06/03/2020 at 11:46 AM  To page go to [email or web address]   [facility] -  Office  [phone]    See all Orders from today for further details    Objective:   Vitals:   06/02/20 1600 06/02/20 2000 06/02/20 2355 06/03/20 0515  BP: (!) 111/55 (!) 117/51 105/74 107/66  Pulse: 90 94 82 80  Resp: 16 19 20 20   Temp: 98.2 F (36.8 C) 97.8 F (36.6 C) 98.1 F (36.7 C) 98.4 F (36.9 C)  TempSrc: Oral Oral Oral Oral  SpO2: 95%  93% 94%  Weight:      Height:        Wt Readings from Last 3 Encounters:  06/02/20 118.6 kg  05/29/20 115.7 kg  10/14/15 134.3 kg     Intake/Output Summary (Last 24 hours) at 06/03/2020 1146 Last data filed at 06/03/2020 1015 Gross per 24 hour  Intake 1348.63 ml  Output 1850 ml  Net -  501.37 ml     Physical Exam  Awake Alert, No new F.N deficits, Normal affect [place].AT,PERRAL Supple Neck,No JVD, No cervical lymphadenopathy appriciated.  Symmetrical Chest wall movement, Good air movement bilaterally, CTAB RRR,No Gallops, Rubs or new Murmurs, No Parasternal Heave +ve B.Sounds, Abd Soft, No tenderness, No organomegaly appriciated, No rebound - guarding or rigidity. No Cyanosis, Clubbing or edema, No new Rash or bruise    Data Review:    CBC Recent Labs  Lab 05/29/20 1330 05/29/20 1500 06/01/20 1432 06/02/20 0246 06/03/20 0143  WBC 8.9  --  10.2 7.5 9.4  HGB 13.4 12.2* 12.7* 11.0* 10.9*  HCT 37.8* 36.0* 38.3* 32.5* 32.0*  PLT 120*  --  154 106* 132*  MCV 98.4  --  102.1* 100.3* 100.9*  MCH 34.9*  --  33.9 34.0 34.4*  MCHC 35.4  --  33.2 33.8 34.1  RDW 12.5  --  12.7 12.7 12.7  LYMPHSABS  --   --   --  0.4* 0.6*  MONOABS   --   --   --  0.2 0.3  EOSABS  --   --   --  0.0 0.0  BASOSABS  --   --   --  0.0 0.0    Recent Labs  Lab 05/29/20 1330 05/29/20 1500 06/01/20 1432 06/01/20 1654 06/01/20 1913 06/02/20 0246 06/02/20 1022 06/02/20 1023 06/03/20 0143  NA 130* 134* 131*  --   --  131*  --   --  132*  K 3.2* 3.1* 3.3*  --   --  4.1  --   --  4.4  CL 89*  --  93*  --   --  98  --   --  98  CO2 27  --  29  --   --  27  --   --  25  GLUCOSE 578*  --  383*  --   --  460*  --   --  312*  BUN 13  --  11  --   --  15  --   --  25*  CREATININE 1.05  --  1.00  --   --  0.93  --   --  0.79  CALCIUM 8.7*  --  8.2*  --   --  7.8*  --   --  8.0*  AST  --   --   --   --  39 30  --   --  32  ALT  --   --   --   --  26 25  --   --  24  ALKPHOS  --   --   --   --  100 90  --   --  93  BILITOT  --   --   --   --  2.4* 1.5*  --   --  1.2  ALBUMIN  --   --   --   --  2.6* 2.3*  --   --  2.2*  MG  --   --   --   --   --  1.8  --   --  1.9  CRP  --   --   --   --  18.6* 17.6*  --   --  13.8*  DDIMER  --   --   --   --  2.15* 1.94*  --   --  1.57*  PROCALCITON  --   --   --   --  0.92  --   --  0.70 0.50  LATICACIDVEN  --   --   --  2.1* 2.9*  --   --   --   --   INR  --   --   --   --  1.5*  --   --   --   --   HGBA1C  --   --   --   --   --  10.9*  --   --   --   BNP  --   --  190.2*  --  197.8*  --  4,243.4*  --  172.5*    ------------------------------------------------------------------------------------------------------------------ No results for input(s): CHOL, HDL, LDLCALC, TRIG, CHOLHDL, LDLDIRECT in the last 72 hours.  Lab Results  Component Value Date   HGBA1C 10.9 (H) 06/02/2020   ------------------------------------------------------------------------------------------------------------------ No results for input(s): TSH, T4TOTAL, T3FREE, THYROIDAB in the last 72 hours.  Invalid input(s): FREET3  Cardiac Enzymes No results for input(s): CKMB, TROPONINI, MYOGLOBIN in the last 168  hours.  Invalid input(s): CK ------------------------------------------------------------------------------------------------------------------    Component Value Date/Time   BNP 172.5 (H) 06/03/2020 0143    Micro Results Recent Results (from the past 240 hour(s))  Covid-19, Flu A+B ([facility])     Status: None   Collection Time: 06/01/20 12:54 PM   Specimen: Nasopharyngeal   Naso  Result Value Ref Range Status   SARS-CoV-2, NAA Not Detected Not Detected Final    Comment: This nucleic acid amplification test was developed and its performance characteristics determined by [employer]. Nucleic acid amplification tests include RT-PCR and TMA. This test has not been FDA cleared or approved. This test has been authorized by FDA under an Emergency Use Authorization (EUA). This test is only authorized for the duration of time the declaration that circumstances exist justifying the authorization of the emergency use of in vitro diagnostic tests for detection of SARS-CoV-2 virus and/or diagnosis of COVID-19 infection under section 564(b)(1) of the Act, 21 U.S.C. 272ZDG-6(Y) (1), unless the authorization is terminated or revoked sooner. When diagnostic testing is negative, the possibility of a false negative result should be considered in the context of a patient's recent exposures and the presence of clinical signs and symptoms consistent with COVID-19. An individual without symptoms of COVID-19 and who is not shedding SARS-CoV-2 virus wo uld expect to have a negative (not detected) result in this assay.    Influenza A, NAA Not Detected Not Detected Final   Influenza B, NAA Not Detected Not Detected Final  Resp Panel by RT-PCR (Flu A&B, Covid) Nasopharyngeal Swab     Status: Abnormal   Collection Time: 06/01/20  3:35 PM   Specimen: Nasopharyngeal Swab; Nasopharyngeal(NP) swabs in vial transport medium  Result Value Ref Range Status   SARS Coronavirus 2 by RT PCR POSITIVE (A)  NEGATIVE Final    Comment: RESULT CALLED TO, READ BACK BY AND VERIFIED WITH: (NOTE) SARS-CoV-2 target nucleic acids are DETECTED.  The SARS-CoV-2 RNA is generally detectable in upper respiratory specimens during the acute phase of infection. Positive results are indicative of the presence of the identified virus, but do not rule out bacterial infection or co-infection with other pathogens not detected by the test. Clinical correlation with patient history and other diagnostic information is necessary to determine patient infection status. The expected result is Negative.  Fact Sheet for Patients: EntrepreneurPulse.com.au  Fact Sheet for Healthcare Providers: IncredibleEmployment.be  This test is not yet approved or cleared by the [place] and  has been authorized for detection and/or diagnosis of SARS-CoV-2 by FDA under an Emergency Use Authorization (EUA).  This EUA will remain in effect (meaning this test can be used) for the duration of  the [place] VID-19 declaration under Section 564(b)(1) of the Act, 21 U.S.C. section 360bbb-3(b)(1), unless the authorization is terminated or revoked sooner.     Influenza A by PCR NEGATIVE NEGATIVE Final    Comment: A,OLEARY @1735  06/01/20 EB   Influenza B by PCR NEGATIVE NEGATIVE Final    Comment: (NOTE) The Xpert Xpress SARS-CoV-2/FLU/RSV plus assay is intended as an aid in the diagnosis of influenza from Nasopharyngeal swab specimens and should not be used as a sole basis for treatment. Nasal washings and aspirates are unacceptable for Xpert Xpress SARS-CoV-2/FLU/RSV testing.  Fact Sheet for Patients: EntrepreneurPulse.com.au  Fact Sheet for Healthcare Providers: IncredibleEmployment.be  This test is not yet approved or cleared by the [place] FDA and has been authorized for detection and/or diagnosis of SARS-CoV-2 by FDA under an Emergency Use  Authorization (EUA). This EUA will remain in effect (meaning this test can be used) for the duration of the COVID-19 declaration under Section 564(b)(1) of the Act, 21 U.S.C. section 360bbb-3(b)(1), unless the authorization is terminated or revoked.  Performed at [facility], [place] [address] [place], [place] [address]   Blood culture (routine x 2)     Status: Abnormal (Preliminary result)   Collection Time: 06/01/20  4:00 PM   Specimen: BLOOD RIGHT FOREARM  Result Value Ref Range Status   Specimen Description BLOOD RIGHT FOREARM  Final   Special Requests   Final    BOTTLES DRAWN AEROBIC AND ANAEROBIC Blood Culture adequate volume   Culture  Setup Time   Final    GRAM POSITIVE COCCI IN CLUSTERS IN BOTH AEROBIC AND ANAEROBIC BOTTLES Organism ID to follow CRITICAL RESULT CALLED TO, READ BACK BY AND VERIFIED WITH: Denton Brick PHARMD, AT 0603 06/02/20 D. VANHOOK    Culture (A)  Final    STAPHYLOCOCCUS AUREUS SUSCEPTIBILITIES TO FOLLOW Performed at [facility], [place] [address], [place],  [address]    Report Status PENDING  Incomplete  Blood Culture ID Panel (Reflexed)     Status: Abnormal   Collection Time: 06/01/20  4:00 PM  Result Value Ref Range Status   Enterococcus faecalis NOT DETECTED NOT DETECTED Final   Enterococcus Faecium NOT DETECTED NOT DETECTED Final   Listeria monocytogenes NOT DETECTED NOT DETECTED Final   Staphylococcus species DETECTED (A) NOT DETECTED Final    Comment: CRITICAL RESULT CALLED TO, READ BACK BY AND VERIFIED WITH: G. ABBOTT PHARMD, AT 0603 06/02/20 D. VANHOOK    Staphylococcus aureus (BCID) DETECTED (A) NOT DETECTED Final    Comment: CRITICAL RESULT CALLED TO, READ BACK BY AND VERIFIED WITH: G. ABBOTT PHARMD, AT 0603 06/02/20 D. VANHOOK    Staphylococcus epidermidis NOT DETECTED NOT DETECTED Final   Staphylococcus lugdunensis NOT DETECTED NOT DETECTED Final   Streptococcus species NOT DETECTED NOT DETECTED Final   Streptococcus  agalactiae NOT DETECTED NOT DETECTED Final   Streptococcus pneumoniae NOT DETECTED NOT DETECTED Final   Streptococcus pyogenes NOT DETECTED NOT DETECTED Final   A.calcoaceticus-baumannii NOT DETECTED NOT DETECTED Final   Bacteroides fragilis NOT DETECTED NOT DETECTED Final   Enterobacterales NOT DETECTED NOT DETECTED Final   Enterobacter cloacae complex NOT DETECTED NOT DETECTED Final   Escherichia coli NOT DETECTED NOT DETECTED Final   Klebsiella aerogenes NOT DETECTED NOT DETECTED Final  Klebsiella oxytoca NOT DETECTED NOT DETECTED Final   Klebsiella pneumoniae NOT DETECTED NOT DETECTED Final   Proteus species NOT DETECTED NOT DETECTED Final   Salmonella species NOT DETECTED NOT DETECTED Final   Serratia marcescens NOT DETECTED NOT DETECTED Final   Haemophilus influenzae NOT DETECTED NOT DETECTED Final   Neisseria meningitidis NOT DETECTED NOT DETECTED Final   Pseudomonas aeruginosa NOT DETECTED NOT DETECTED Final   Stenotrophomonas maltophilia NOT DETECTED NOT DETECTED Final   Candida albicans NOT DETECTED NOT DETECTED Final   Candida auris NOT DETECTED NOT DETECTED Final   Candida glabrata NOT DETECTED NOT DETECTED Final   Candida krusei NOT DETECTED NOT DETECTED Final   Candida parapsilosis NOT DETECTED NOT DETECTED Final   Candida tropicalis NOT DETECTED NOT DETECTED Final   Cryptococcus neoformans/gattii NOT DETECTED NOT DETECTED Final   Meth resistant mecA/C and MREJ NOT DETECTED NOT DETECTED Final    Comment: Performed at [facility], [place] [address] [place], [place] [address]  Blood culture (routine x 2)     Status: Abnormal (Preliminary result)   Collection Time: 06/01/20  4:02 PM   Specimen: BLOOD LEFT FOREARM  Result Value Ref Range Status   Specimen Description BLOOD LEFT FOREARM  Final   Special Requests   Final    BOTTLES DRAWN AEROBIC AND ANAEROBIC Blood Culture adequate volume   Culture  Setup Time   Final    GRAM POSITIVE COCCI IN CLUSTERS IN BOTH  AEROBIC AND ANAEROBIC BOTTLES Performed at [facility], [place] [address], [place],  [address]    Culture STAPHYLOCOCCUS AUREUS (A)  Final   Report Status PENDING  Incomplete    Radiology Reports DG Chest 2 View  Result Date: 06/01/2020 CLINICAL DATA:  Shortness of breath EXAM: CHEST - 2 VIEW COMPARISON:  06/01/2020 prior studies FINDINGS: The cardiomediastinal silhouette is unremarkable. Minimal pulmonary vascular congestion present. There is no evidence of focal airspace disease, pulmonary edema, suspicious pulmonary nodule/mass, pleural effusion, or pneumothorax. No acute bony abnormalities are identified. IMPRESSION: Minimal pulmonary vascular congestion. Electronically Signed   By: Margarette Canada M.D.   On: 06/01/2020 14:59   DG Chest 2 View  Result Date: 06/01/2020 CLINICAL DATA:  Cough and shortness of breath EXAM: CHEST - 2 VIEW COMPARISON:  05/29/20 FINDINGS: The heart size and mediastinal contours are within normal limits. Both lungs are clear. Increased pulmonary vascular congestion without overt edema. No pleural effusion. The visualized skeletal structures are unremarkable. IMPRESSION: Increased pulmonary vascular congestion without overt edema. Electronically Signed   By: Kerby Moors M.D.   On: 06/01/2020 13:12   DG Chest 2 View  Result Date: 05/29/2020 CLINICAL DATA:  Chest pain and shortness of breath beginning yesterday. Intermittent hemoptysis. EXAM: CHEST - 2 VIEW COMPARISON:  01/04/2015 FINDINGS: The heart size and mediastinal contours are within normal limits. Both lungs are clear. The visualized skeletal structures are unremarkable. IMPRESSION: No active cardiopulmonary disease. Electronically Signed   By: Marlaine Hind M.D.   On: 05/29/2020 13:48   CT Angio Chest PE W/Cm &/Or Wo Cm  Result Date: 05/29/2020 CLINICAL DATA:  Chest pain. EXAM: CT ANGIOGRAPHY CHEST WITH CONTRAST TECHNIQUE: Multidetector CT imaging of the chest was performed using the standard protocol  during bolus administration of intravenous contrast. Multiplanar CT image reconstructions and MIPs were obtained to evaluate the vascular anatomy. CONTRAST:  125mL OMNIPAQUE IOHEXOL 350 MG/ML SOLN COMPARISON:  January 04, 2015. FINDINGS: Cardiovascular: Satisfactory opacification of the pulmonary arteries to the segmental  level. No evidence of pulmonary embolism. Normal heart size. No pericardial effusion. Mediastinum/Nodes: No enlarged mediastinal, hilar, or axillary lymph nodes. Thyroid gland, trachea, and esophagus demonstrate no significant findings. Lungs/Pleura: No pneumothorax or pleural effusion is noted. 5 mm nodule is noted in left lower lobe best seen on image number 53 of series 5. 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 Mild splenomegaly is noted. Musculoskeletal: No chest wall abnormality. No acute or significant osseous findings. Review of the MIP images confirms the above findings. IMPRESSION: 1. No definite evidence of pulmonary embolus. 2. 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 Mild splenomegaly is noted. 3. 5 mm left lower lobe nodule is noted. No follow-up needed if patient is low-risk. Non-contrast chest CT can be considered in 12 months if patient is high-risk. This recommendation follows the consensus statement: Guidelines for Management of Incidental Pulmonary Nodules Detected on CT Images: From the Fleischner Society 2017; Radiology 2017; 284:228-243. Electronically Signed   By: Marijo Conception M.D.   On: 05/29/2020 15:52   US Venous Img Lower Unilateral Left  Result Date: 05/29/2020 CLINICAL DATA:  Left leg pain EXAM: LEFT LOWER EXTREMITY VENOUS DOPPLER ULTRASOUND TECHNIQUE: Gray-scale sonography with graded compression, as well as color Doppler and duplex ultrasound were performed to evaluate the lower extremity deep venous systems from the level of the common femoral vein and including the common femoral, femoral, profunda femoral, popliteal and calf veins including the posterior  tibial, peroneal and gastrocnemius veins when visible. The superficial great saphenous vein was also interrogated. Spectral Doppler was utilized to evaluate flow at rest and with distal augmentation maneuvers in the common femoral, femoral and popliteal veins. COMPARISON:  None. FINDINGS: Contralateral Common Femoral Vein: Respiratory phasicity is normal and symmetric with the symptomatic side. No evidence of thrombus. Normal compressibility. Common Femoral Vein: No evidence of thrombus. Normal compressibility, respiratory phasicity and response to augmentation. Saphenofemoral Junction: No evidence of thrombus. Normal compressibility and flow on color Doppler imaging. Profunda Femoral Vein: No evidence of thrombus. Normal compressibility and flow on color Doppler imaging. Femoral Vein: No evidence of thrombus. Normal compressibility, respiratory phasicity and response to augmentation. Popliteal Vein: No evidence of thrombus. Normal compressibility, respiratory phasicity and response to augmentation. Calf Veins: No evidence of thrombus. Normal compressibility and flow on color Doppler imaging. Superficial Great Saphenous Vein: No evidence of thrombus. Normal compressibility. Venous Reflux:  None. Other Findings:  None. IMPRESSION: No evidence of deep venous thrombosis. Electronically Signed   By: Inez Catalina M.D.   On: 05/29/2020 15:57   DG Chest Port 1 View  Result Date: 06/02/2020 CLINICAL DATA:  60 year old male with shortness of breath, history of COVID 19. EXAM: PORTABLE CHEST - 1 VIEW COMPARISON:  06/01/2020 FINDINGS: The mediastinal contours are within normal limits. No cardiomegaly. The lungs are clear bilaterally without evidence of focal consolidation, pleural effusion, or pneumothorax. No acute osseous abnormality. IMPRESSION: No acute cardiopulmonary process. Electronically Signed   By: Ruthann Cancer MD   On: 06/02/2020 08:47

## 2020-06-03 NOTE — Plan of Care (Signed)

## 2020-06-03 NOTE — Progress Notes (Signed)
SATURATION QUALIFICATIONS: (This note is used to comply with regulatory documentation for home oxygen)  Patient Saturations on Room Air at Rest = 94%  Patient Saturations on Room Air while Ambulating = 92%  

## 2020-06-04 DIAGNOSIS — B9561 Methicillin susceptible Staphylococcus aureus infection as the cause of diseases classified elsewhere: Secondary | ICD-10-CM | POA: Diagnosis not present

## 2020-06-04 DIAGNOSIS — R7881 Bacteremia: Secondary | ICD-10-CM | POA: Diagnosis not present

## 2020-06-04 DIAGNOSIS — R0600 Dyspnea, unspecified: Secondary | ICD-10-CM

## 2020-06-04 DIAGNOSIS — R509 Fever, unspecified: Secondary | ICD-10-CM | POA: Diagnosis not present

## 2020-06-04 DIAGNOSIS — U071 COVID-19: Secondary | ICD-10-CM | POA: Diagnosis not present

## 2020-06-04 LAB — CBC WITH DIFFERENTIAL/PLATELET
Abs Immature Granulocytes: 0.07 10*3/uL (ref 0.00–0.07)
Basophils Absolute: 0 10*3/uL (ref 0.0–0.1)
Basophils Relative: 0 %
Eosinophils Absolute: 0 10*3/uL (ref 0.0–0.5)
Eosinophils Relative: 0 %
HCT: 36.7 % — ABNORMAL LOW (ref 39.0–52.0)
Hemoglobin: 12.4 g/dL — ABNORMAL LOW (ref 13.0–17.0)
Immature Granulocytes: 1 %
Lymphocytes Relative: 12 %
Lymphs Abs: 1.7 10*3/uL (ref 0.7–4.0)
MCH: 34.3 pg — ABNORMAL HIGH (ref 26.0–34.0)
MCHC: 33.8 g/dL (ref 30.0–36.0)
MCV: 101.7 fL — ABNORMAL HIGH (ref 80.0–100.0)
Monocytes Absolute: 1 10*3/uL (ref 0.1–1.0)
Monocytes Relative: 8 %
Neutro Abs: 10.5 10*3/uL — ABNORMAL HIGH (ref 1.7–7.7)
Neutrophils Relative %: 79 %
Platelets: 196 10*3/uL (ref 150–400)
RBC: 3.61 MIL/uL — ABNORMAL LOW (ref 4.22–5.81)
RDW: 12.7 % (ref 11.5–15.5)
WBC: 13.3 10*3/uL — ABNORMAL HIGH (ref 4.0–10.5)
nRBC: 0 % (ref 0.0–0.2)

## 2020-06-04 LAB — CULTURE, BLOOD (ROUTINE X 2)
Special Requests: ADEQUATE
Special Requests: ADEQUATE

## 2020-06-04 LAB — PROCALCITONIN: Procalcitonin: 0.35 ng/mL

## 2020-06-04 LAB — GLUCOSE, CAPILLARY
Glucose-Capillary: 180 mg/dL — ABNORMAL HIGH (ref 70–99)
Glucose-Capillary: 186 mg/dL — ABNORMAL HIGH (ref 70–99)
Glucose-Capillary: 268 mg/dL — ABNORMAL HIGH (ref 70–99)
Glucose-Capillary: 210 mg/dL — ABNORMAL HIGH (ref 70–99)

## 2020-06-04 LAB — COMPREHENSIVE METABOLIC PANEL
ALT: 35 U/L (ref 0–44)
AST: 60 U/L — ABNORMAL HIGH (ref 15–41)
Albumin: 2.5 g/dL — ABNORMAL LOW (ref 3.5–5.0)
Alkaline Phosphatase: 105 U/L (ref 38–126)
Anion gap: 11 (ref 5–15)
BUN: 25 mg/dL — ABNORMAL HIGH (ref 8–23)
CO2: 26 mmol/L (ref 22–32)
Calcium: 8.7 mg/dL — ABNORMAL LOW (ref 8.9–10.3)
Chloride: 98 mmol/L (ref 98–111)
Creatinine, Ser: 0.82 mg/dL (ref 0.61–1.24)
GFR, Estimated: >60 mL/min (ref >60)
Glucose, Bld: 177 mg/dL — ABNORMAL HIGH (ref 70–99)
Potassium: 3.7 mmol/L (ref 3.5–5.1)
Sodium: 135 mmol/L (ref 135–145)
Total Bilirubin: 1.3 mg/dL — ABNORMAL HIGH (ref 0.3–1.2)
Total Protein: 6.7 g/dL (ref 6.5–8.1)

## 2020-06-04 LAB — D-DIMER, QUANTITATIVE: D-Dimer, Quant: 1.82 ug/mL-FEU — ABNORMAL HIGH (ref 0.00–0.50)

## 2020-06-04 LAB — C-REACTIVE PROTEIN: CRP: 10.6 mg/dL — ABNORMAL HIGH (ref <1.0)

## 2020-06-04 LAB — MAGNESIUM: Magnesium: 2 mg/dL (ref 1.7–2.4)

## 2020-06-04 LAB — BRAIN NATRIURETIC PEPTIDE: B Natriuretic Peptide: 156.3 pg/mL — ABNORMAL HIGH (ref 0.0–100.0)

## 2020-06-04 NOTE — Progress Notes (Signed)
[facility]   Reason for visit: Follow up on Staph bacteremia  Interval History: TTE without signs of vegetation; WBC 13.3, remains afebrile.  No new complaints.  He is hopeful to go home tomorrow.      Physical Exam: Constitutional:  Vitals:   06/03/20 2029 06/04/20 0520  BP: (!) 108/58 115/61  Pulse: 84 67  Resp: 20 15  Temp: 98.3 F (36.8 C) 98.1 F (36.7 C)  SpO2: 96% 92%   patient appears in NAD;  Respiratory: Normal respiratory effort; CTA B Cardiovascular: RRR GI: soft, nt, nd  Review of Systems: Constitutional: negative for fevers and chills Gastrointestinal: negative for nausea and diarrhea Integument/breast: negative for rash  Lab Results  Component Value Date   WBC 13.3 (H) 06/04/2020   HGB 12.4 (L) 06/04/2020   HCT 36.7 (L) 06/04/2020   MCV 101.7 (H) 06/04/2020   PLT 196 06/04/2020    Lab Results  Component Value Date   CREATININE 0.82 06/04/2020   BUN 25 (H) 06/04/2020   NA 135 06/04/2020   K 3.7 06/04/2020   CL 98 06/04/2020   CO2 26 06/04/2020    Lab Results  Component Value Date   ALT 35 06/04/2020   AST 60 (H) 06/04/2020   ALKPHOS 105 06/04/2020     Microbiology: Recent Results (from the past 240 hour(s))  Covid-19, Flu A+B ([facility])     Status: None   Collection Time: 06/01/20 12:54 PM   Specimen: Nasopharyngeal   Naso  Result Value Ref Range Status   SARS-CoV-2, NAA Not Detected Not Detected Final    Comment: This nucleic acid amplification test was developed and its performance characteristics determined by [employer]. Nucleic acid amplification tests include RT-PCR and TMA. This test has not been FDA cleared or approved. This test has been authorized by FDA under an Emergency Use Authorization (EUA). This test is only authorized for the duration of time the declaration that circumstances exist justifying the authorization of the emergency use of in vitro diagnostic tests for detection of  SARS-CoV-2 virus and/or diagnosis of COVID-19 infection under section 564(b)(1) of the Act, 21 U.S.C. 675FFM-3(W) (1), unless the authorization is terminated or revoked sooner. When diagnostic testing is negative, the possibility of a false negative result should be considered in the context of a patient's recent exposures and the presence of clinical signs and symptoms consistent with COVID-19. An individual without symptoms of COVID-19 and who is not shedding SARS-CoV-2 virus wo uld expect to have a negative (not detected) result in this assay.    Influenza A, NAA Not Detected Not Detected Final   Influenza B, NAA Not Detected Not Detected Final  Resp Panel by RT-PCR (Flu A&B, Covid) Nasopharyngeal Swab     Status: Abnormal   Collection Time: 06/01/20  3:35 PM   Specimen: Nasopharyngeal Swab; Nasopharyngeal(NP) swabs in vial transport medium  Result Value Ref Range Status   SARS Coronavirus 2 by RT PCR POSITIVE (A) NEGATIVE Final    Comment: RESULT CALLED TO, READ BACK BY AND VERIFIED WITH: (NOTE) SARS-CoV-2 target nucleic acids are DETECTED.  The SARS-CoV-2 RNA is generally detectable in upper respiratory specimens during the acute phase of infection. Positive results are indicative of the presence of the identified virus, but do not rule out bacterial infection or co-infection with other pathogens not detected by the test. Clinical correlation with patient history and other diagnostic information is necessary to determine patient infection status. The expected result is  Negative.  Fact Sheet for Patients: EntrepreneurPulse.com.au  Fact Sheet for Healthcare Providers: IncredibleEmployment.be  This test is not yet approved or cleared by the [place] FDA and  has been authorized for detection and/or diagnosis of SARS-CoV-2 by FDA under an Emergency Use Authorization (EUA).  This EUA will remain in effect (meaning this test can be used)  for the duration of  the [place] VID-19 declaration under Section 564(b)(1) of the Act, 21 U.S.C. section 360bbb-3(b)(1), unless the authorization is terminated or revoked sooner.     Influenza A by PCR NEGATIVE NEGATIVE Final    Comment: A,OLEARY @1735  06/01/20 EB   Influenza B by PCR NEGATIVE NEGATIVE Final    Comment: (NOTE) The Xpert Xpress SARS-CoV-2/FLU/RSV plus assay is intended as an aid in the diagnosis of influenza from Nasopharyngeal swab specimens and should not be used as a sole basis for treatment. Nasal washings and aspirates are unacceptable for Xpert Xpress SARS-CoV-2/FLU/RSV testing.  Fact Sheet for Patients: EntrepreneurPulse.com.au  Fact Sheet for Healthcare Providers: IncredibleEmployment.be  This test is not yet approved or cleared by the [place] FDA and has been authorized for detection and/or diagnosis of SARS-CoV-2 by FDA under an Emergency Use Authorization (EUA). This EUA will remain in effect (meaning this test can be used) for the duration of the COVID-19 declaration under Section 564(b)(1) of the Act, 21 U.S.C. section 360bbb-3(b)(1), unless the authorization is terminated or revoked.  Performed at [facility], [place] [address], [place], [place] [address]   Blood culture (routine x 2)     Status: Abnormal   Collection Time: 06/01/20  4:00 PM   Specimen: BLOOD RIGHT FOREARM  Result Value Ref Range Status   Specimen Description BLOOD RIGHT FOREARM  Final   Special Requests   Final    BOTTLES DRAWN AEROBIC AND ANAEROBIC Blood Culture adequate volume   Culture  Setup Time   Final    GRAM POSITIVE COCCI IN CLUSTERS IN BOTH AEROBIC AND ANAEROBIC BOTTLES Organism ID to follow CRITICAL RESULT CALLED TO, READ BACK BY AND VERIFIED WITHSalli Real, AT 0603 06/02/20 Rush [facility]mark Performed at [facility], [place] [address] [place], [place] [address]    Culture STAPHYLOCOCCUS AUREUS (A)  Final   Report  Status 06/04/2020 FINAL  Final   Organism ID, Bacteria STAPHYLOCOCCUS AUREUS  Final      Susceptibility   Staphylococcus aureus - MIC*    CIPROFLOXACIN <=0.5 SENSITIVE Sensitive     ERYTHROMYCIN <=0.25 SENSITIVE Sensitive     GENTAMICIN <=0.5 SENSITIVE Sensitive     OXACILLIN <=0.25 SENSITIVE Sensitive     TETRACYCLINE <=1 SENSITIVE Sensitive     VANCOMYCIN 1 SENSITIVE Sensitive     TRIMETH/SULFA <=10 SENSITIVE Sensitive     CLINDAMYCIN <=0.25 SENSITIVE Sensitive     RIFAMPIN <=0.5 SENSITIVE Sensitive     Inducible Clindamycin NEGATIVE Sensitive     * STAPHYLOCOCCUS AUREUS  Blood Culture ID Panel (Reflexed)     Status: Abnormal   Collection Time: 06/01/20  4:00 PM  Result Value Ref Range Status   Enterococcus faecalis NOT DETECTED NOT DETECTED Final   Enterococcus Faecium NOT DETECTED NOT DETECTED Final   Listeria monocytogenes NOT DETECTED NOT DETECTED Final   Staphylococcus species DETECTED (A) NOT DETECTED Final    Comment: CRITICAL RESULT CALLED TO, READ BACK BY AND VERIFIED WITH: Denton Brick PHARMD, AT 0603 06/02/20 D. VANHOOK    Staphylococcus aureus (BCID) DETECTED (A) NOT DETECTED Final  Comment: CRITICAL RESULT CALLED TO, READ BACK BY AND VERIFIED WITH: G. ABBOTT PHARMD, AT 0603 06/02/20 D. VANHOOK    Staphylococcus epidermidis NOT DETECTED NOT DETECTED Final   Staphylococcus lugdunensis NOT DETECTED NOT DETECTED Final   Streptococcus species NOT DETECTED NOT DETECTED Final   Streptococcus agalactiae NOT DETECTED NOT DETECTED Final   Streptococcus pneumoniae NOT DETECTED NOT DETECTED Final   Streptococcus pyogenes NOT DETECTED NOT DETECTED Final   A.calcoaceticus-baumannii NOT DETECTED NOT DETECTED Final   Bacteroides fragilis NOT DETECTED NOT DETECTED Final   Enterobacterales NOT DETECTED NOT DETECTED Final   Enterobacter cloacae complex NOT DETECTED NOT DETECTED Final   Escherichia coli NOT DETECTED NOT DETECTED Final   Klebsiella aerogenes NOT DETECTED NOT DETECTED  Final   Klebsiella oxytoca NOT DETECTED NOT DETECTED Final   Klebsiella pneumoniae NOT DETECTED NOT DETECTED Final   Proteus species NOT DETECTED NOT DETECTED Final   Salmonella species NOT DETECTED NOT DETECTED Final   Serratia marcescens NOT DETECTED NOT DETECTED Final   Haemophilus influenzae NOT DETECTED NOT DETECTED Final   Neisseria meningitidis NOT DETECTED NOT DETECTED Final   Pseudomonas aeruginosa NOT DETECTED NOT DETECTED Final   Stenotrophomonas maltophilia NOT DETECTED NOT DETECTED Final   Candida albicans NOT DETECTED NOT DETECTED Final   Candida auris NOT DETECTED NOT DETECTED Final   Candida glabrata NOT DETECTED NOT DETECTED Final   Candida krusei NOT DETECTED NOT DETECTED Final   Candida parapsilosis NOT DETECTED NOT DETECTED Final   Candida tropicalis NOT DETECTED NOT DETECTED Final   Cryptococcus neoformans/gattii NOT DETECTED NOT DETECTED Final   Meth resistant mecA/C and MREJ NOT DETECTED NOT DETECTED Final    Comment: Performed at [facility], [address] [place], [place] [address]  Blood culture (routine x 2)     Status: Abnormal   Collection Time: 06/01/20  4:02 PM   Specimen: BLOOD LEFT FOREARM  Result Value Ref Range Status   Specimen Description BLOOD LEFT FOREARM  Final   Special Requests   Final    BOTTLES DRAWN AEROBIC AND ANAEROBIC Blood Culture adequate volume   Culture  Setup Time   Final    GRAM POSITIVE COCCI IN CLUSTERS IN BOTH AEROBIC AND ANAEROBIC BOTTLES    Culture (A)  Final    STAPHYLOCOCCUS AUREUS SUSCEPTIBILITIES PERFORMED ON PREVIOUS CULTURE WITHIN THE LAST 5 DAYS. Performed at [facility], [place] [address], [place], [place] [address]    Report Status 06/04/2020 FINAL  Final  Culture, blood (routine x 2)     Status: None (Preliminary result)   Collection Time: 06/03/20  9:13 AM   Specimen: BLOOD  Result Value Ref Range Status   Specimen Description BLOOD LEFT ANTECUBITAL  Final   Special Requests   Final    BOTTLES  DRAWN AEROBIC AND ANAEROBIC Blood Culture adequate volume   Culture   Final    NO GROWTH < 24 HOURS Performed at [place] [facility], [place] [address] [place], [place] [address]    Report Status PENDING  Incomplete  Culture, blood (routine x 2)     Status: None (Preliminary result)   Collection Time: 06/03/20  9:14 AM   Specimen: BLOOD  Result Value Ref Range Status   Specimen Description BLOOD BLOOD LEFT HAND  Final   Special Requests   Final    BOTTLES DRAWN AEROBIC AND ANAEROBIC Blood Culture adequate volume   Culture   Final    NO GROWTH < 24 HOURS Performed  at [facility], [place] [address] [place], [place] [address]    Report Status PENDING  Incomplete    Impression/Plan:  1. Staph aureus bacteremia - his repeat blood cultures have remained ngtd so far.  He is doing well clinically and TTE without vegetation. I have spoken to cardiology/card master and a TEE is scheduled for Friday afternoon.  If his TEE is negative for a vegetation, I will have him discharged with oral antibiotic therapy since he will have had a week of antibiotics IV  2.  Medication monitoring - no leukopenia noted on WBC and doing well on cefazolin  3.  COVID -19 infection.  No significant symptoms at this time and on treatment per Dr. Candiss Norse with remdesivir.

## 2020-06-04 NOTE — Plan of Care (Signed)
  Problem: Health Behavior/Discharge Planning: Goal: Ability to manage health-related needs will improve Outcome: Progressing   Problem: Clinical Measurements: Goal: Ability to maintain clinical measurements within normal limits will improve Outcome: Progressing Goal: Will remain free from infection Outcome: Progressing Goal: Respiratory complications will improve Outcome: Progressing   Problem: Safety: Goal: Ability to remain free from injury will improve Outcome: Progressing   Problem: Education: Goal: Knowledge of risk factors and measures for prevention of condition will improve Outcome: Progressing   Problem: Coping: Goal: Psychosocial and spiritual needs will be supported Outcome: Progressing   Problem: Respiratory: Goal: Will maintain a patent airway Outcome: Progressing Goal: Complications related to the disease process, condition or treatment will be avoided or minimized Outcome: Progressing   Problem: Coping: Goal: Ability to adjust to condition or change in health will improve Outcome: Progressing   Problem: Nutritional: Goal: Maintenance of adequate nutrition will improve Outcome: Progressing

## 2020-06-04 NOTE — Plan of Care (Signed)

## 2020-06-04 NOTE — Progress Notes (Addendum)
PROGRESS NOTE                                                                                                                                                                                                             Patient Demographics:    Travis Hampton, is a 60 y.o. male, DOB - 1960/11/04, HKV:[id number]  Outpatient Primary MD for the patient is Reynold Bowen, MD    LOS - 3  Admit date - 06/01/2020    Chief Complaint  Patient presents with  . Shortness of Breath       Brief Narrative (HPI from H&P) - Travis Hampton is a 60 y.o. 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   Subjective:   Patient in bed, appears comfortable, denies any headache, no fever, no chest pain or pressure, no shortness of breath , no abdominal pain. No focal weakness.   Assessment  & Plan :     1. Acute Hypoxic Resp. Failure due to Acute Covid 19 Viral Pneumonitis during the ongoing 2020 Covid 19 Pandemic - he is partially vaccinated with 2 mRNA shots, he has been started on IV steroids & Remdesivir. Encouraged the patient to sit up in chair in the daytime use I-S and flutter valve for pulmonary toiletry.  Will advance activity and titrate down oxygen as possible.  Clinically much better start tapering steroids.   SpO2: 92 %  Recent Labs  Lab 05/29/20 1330 05/29/20 1500 06/01/20 1432 06/01/20 1535 06/01/20 1654 06/01/20 1913 06/02/20 0246 06/02/20 1022 06/02/20 1023 06/03/20 0143 06/04/20 0107  WBC 8.9  --  10.2  --   --   --  7.5  --   --  9.4 13.3*  HGB 13.4 12.2* 12.7*  --   --   --  11.0*  --   --  10.9* 12.4*  HCT 37.8* 36.0* 38.3*  --   --   --  32.5*  --   --  32.0* 36.7*  PLT 120*  --  154  --   --   --  106*  --   --  132* 196  CRP  --    --   --   --   --  18.6* 17.6*  --   --  13.8* 10.6*  BNP  --   --  190.2*  --   --  197.8*  --  4,243.4*  --  172.5* 156.3*  DDIMER  --   --   --   --   --  2.15* 1.94*  --   --  1.57* 1.82*  PROCALCITON  --   --   --   --   --  0.92  --   --  0.70 0.50 0.35  AST  --   --   --   --   --  39 30  --   --  32 60*  ALT  --   --   --   --   --  26 25  --   --  24 35  ALKPHOS  --   --   --   --   --  100 90  --   --  93 105  BILITOT  --   --   --   --   --  2.4* 1.5*  --   --  1.2 1.3*  ALBUMIN  --   --   --   --   --  2.6* 2.3*  --   --  2.2* 2.5*  INR  --   --   --   --   --  1.5*  --   --   --   --   --   LATICACIDVEN  --   --   --   --  2.1* 2.9*  --   --   --   --   --   SARSCOV2NAA  --   --   --  POSITIVE*  --   --   --   --   --   --   --       2. MSSA bacteremia.- pro calcitonin was borderline, ID following currently on empiric IV antibiotics may require PICC line likely on 06/05/20 if repeat cultures stay -ve > 48 hrs, echo stable.  Repeat cultures drawn on 06/03/2020. ? Source, does have a few scabs on his R.leg.  3.  Dyslipidemia.  On statin continue.  4.  Elevated lactic acid, hydrate, hold Glucophage.    5. DM 2 -poor outpatient control due to hyperglycemia: Placed on Lantus along with sliding scale, DM and insulin education while he is here.  Steroids being rapidly tapered, monitor closely.  Lab Results  Component Value Date   HGBA1C 10.9 (H) 06/02/2020   CBG (last 3)  Recent Labs    06/03/20 1735 06/03/20 2035 06/04/20 0739  GLUCAP 260* 236* 180*         Condition - Fair  Family Communication  : Sherryll Burger [phone] 06/02/20  Code Status :  Full  Consults  :  None  PUD Prophylaxis : None   Procedures  :     TTE -  1. Left ventricular ejection fraction, by estimation, is 60 to 65%. The left ventricle has normal function. The left ventricle has no regional wall motion abnormalities. Left ventricular diastolic function could not be evaluated.  2. Right  ventricular systolic function is normal. The right ventricular size is normal.  3. The mitral valve is normal in structure. No evidence of mitral valve regurgitation. No evidence of mitral stenosis.  4. The aortic valve is tricuspid. Aortic valve regurgitation is not visualized. No aortic stenosis is present.   PICC  Disposition Plan  :    Status is: Inpatient  Remains inpatient appropriate because:IV treatments appropriate due to intensity of illness or inability to take PO   Dispo: The patient is from: Home              Anticipated d/c is to: Home              Patient currently is not medically stable to d/c.   Difficult to place patient No  DVT Prophylaxis  :  Lovenox    Lab Results  Component Value Date   PLT 196 06/04/2020    Diet :  Diet Order            Diet heart healthy/carb modified Room service appropriate? Yes; Fluid consistency: Thin  Diet effective now                  Inpatient Medications  Scheduled Meds: . atorvastatin  20 mg Oral Daily  . enoxaparin (LOVENOX) injection  40 mg Subcutaneous Q24H  . insulin aspart  0-20 Units Subcutaneous TID WC  . insulin aspart  0-5 Units Subcutaneous QHS  . insulin aspart  4 Units Subcutaneous TID WC  . insulin glargine  25 Units Subcutaneous Daily  . methylPREDNISolone (SOLU-MEDROL) injection  40 mg Intravenous Q24H   Continuous Infusions: .  ceFAZolin (ANCEF) IV 2 g (06/04/20 0548)  . remdesivir 100 mg in NS 100 mL 100 mg (06/04/20 1024)   PRN Meds:.acetaminophen, guaiFENesin-dextromethorphan, Ipratropium-Albuterol, senna-docusate  Antibiotics  :    Anti-infectives (From admission, onward)   Start     Dose/Rate Route Frequency Ordered Stop   06/02/20 1745  ceFAZolin (ANCEF) IVPB 2g/100 mL premix        2 g 200 mL/hr over 30 Minutes Intravenous Every 8 hours 06/02/20 1710     06/02/20 1145  levofloxacin (LEVAQUIN) tablet 500 mg  Status:  Discontinued        500 mg Oral Daily 06/02/20 1048 06/02/20  1713   06/02/20 1000  remdesivir 100 mg in sodium chloride 0.9 % 100 mL IVPB       "Followed by" Linked Group Details   100 mg 200 mL/hr over 30 Minutes Intravenous Daily 06/01/20 1816 06/06/20 0959   06/02/20 0645  ceFAZolin (ANCEF) IVPB 2g/100 mL premix  Status:  Discontinued        2 g 200 mL/hr over 30 Minutes Intravenous Every 8 hours 06/02/20 0613 06/02/20 1044   06/01/20 1815  remdesivir 200 mg in sodium chloride 0.9% 250 mL IVPB       "Followed by" Linked Group Details   200 mg 580 mL/hr over 30 Minutes Intravenous Once 06/01/20 1816 06/01/20 2013   06/01/20 1515  cefTRIAXone (ROCEPHIN) 1 g in sodium chloride 0.9 % 100 mL IVPB        1 g 200 mL/hr over 30 Minutes Intravenous  Once 06/01/20 1508 06/01/20 1642   06/01/20 1515  azithromycin (ZITHROMAX) 500 mg in sodium chloride 0.9 % 250 mL IVPB        500 mg 250 mL/hr over 60 Minutes Intravenous  Once 06/01/20 1508 06/01/20 1757       Time Spent in minutes  30   Lala Lund M.D on 06/04/2020 at 10:51 AM  To page go to [email or web address]   [facility] -  Office  [phone]    See all Orders from today for further details    Objective:   Vitals:   06/03/20 0515 06/03/20 1357 06/03/20  2029 06/04/20 0520  BP: 107/66 118/62 (!) 108/58 115/61  Pulse: 80 99 84 67  Resp: 20 20 20 15   Temp: 98.4 F (36.9 C) 98.2 F (36.8 C) 98.3 F (36.8 C) 98.1 F (36.7 C)  TempSrc: Oral Oral Oral Oral  SpO2: 94% 96% 96% 92%  Weight:      Height:        Wt Readings from Last 3 Encounters:  06/02/20 118.6 kg  05/29/20 115.7 kg  10/14/15 134.3 kg     Intake/Output Summary (Last 24 hours) at 06/04/2020 1051 Last data filed at 06/04/2020 0815 Gross per 24 hour  Intake 903.07 ml  Output 1400 ml  Net -496.93 ml     Physical Exam  Awake Alert, No new F.N deficits, Normal affect [place].AT,PERRAL Supple Neck,No JVD, No cervical lymphadenopathy appriciated.  Symmetrical Chest wall movement, Good air movement bilaterally,  CTAB RRR,No Gallops, Rubs or new Murmurs, No Parasternal Heave +ve B.Sounds, Abd Soft, No tenderness, No organomegaly appriciated, No rebound - guarding or rigidity. No Cyanosis, Clubbing or edema, No new Rash or bruise     Data Review:    CBC Recent Labs  Lab 05/29/20 1330 05/29/20 1500 06/01/20 1432 06/02/20 0246 06/03/20 0143 06/04/20 0107  WBC 8.9  --  10.2 7.5 9.4 13.3*  HGB 13.4 12.2* 12.7* 11.0* 10.9* 12.4*  HCT 37.8* 36.0* 38.3* 32.5* 32.0* 36.7*  PLT 120*  --  154 106* 132* 196  MCV 98.4  --  102.1* 100.3* 100.9* 101.7*  MCH 34.9*  --  33.9 34.0 34.4* 34.3*  MCHC 35.4  --  33.2 33.8 34.1 33.8  RDW 12.5  --  12.7 12.7 12.7 12.7  LYMPHSABS  --   --   --  0.4* 0.6* 1.7  MONOABS  --   --   --  0.2 0.3 1.0  EOSABS  --   --   --  0.0 0.0 0.0  BASOSABS  --   --   --  0.0 0.0 0.0    Recent Labs  Lab 05/29/20 1330 05/29/20 1500 06/01/20 1432 06/01/20 1654 06/01/20 1913 06/02/20 0246 06/02/20 1022 06/02/20 1023 06/03/20 0143 06/04/20 0107  NA 130* 134* 131*  --   --  131*  --   --  132* 135  K 3.2* 3.1* 3.3*  --   --  4.1  --   --  4.4 3.7  CL 89*  --  93*  --   --  98  --   --  98 98  CO2 27  --  29  --   --  27  --   --  25 26  GLUCOSE 578*  --  383*  --   --  460*  --   --  312* 177*  BUN 13  --  11  --   --  15  --   --  25* 25*  CREATININE 1.05  --  1.00  --   --  0.93  --   --  0.79 0.82  CALCIUM 8.7*  --  8.2*  --   --  7.8*  --   --  8.0* 8.7*  AST  --   --   --   --  39 30  --   --  32 60*  ALT  --   --   --   --  26 25  --   --  24 35  ALKPHOS  --   --   --   --  100 90  --   --  93 105  BILITOT  --   --   --   --  2.4* 1.5*  --   --  1.2 1.3*  ALBUMIN  --   --   --   --  2.6* 2.3*  --   --  2.2* 2.5*  MG  --   --   --   --   --  1.8  --   --  1.9 2.0  CRP  --   --   --   --  18.6* 17.6*  --   --  13.8* 10.6*  DDIMER  --   --   --   --  2.15* 1.94*  --   --  1.57* 1.82*  PROCALCITON  --   --   --   --  0.92  --   --  0.70 0.50 0.35  LATICACIDVEN   --   --   --  2.1* 2.9*  --   --   --   --   --   INR  --   --   --   --  1.5*  --   --   --   --   --   HGBA1C  --   --   --   --   --  10.9*  --   --   --   --   BNP  --   --  190.2*  --  197.8*  --  4,243.4*  --  172.5* 156.3*    ------------------------------------------------------------------------------------------------------------------ No results for input(s): CHOL, HDL, LDLCALC, TRIG, CHOLHDL, LDLDIRECT in the last 72 hours.  Lab Results  Component Value Date   HGBA1C 10.9 (H) 06/02/2020   ------------------------------------------------------------------------------------------------------------------ No results for input(s): TSH, T4TOTAL, T3FREE, THYROIDAB in the last 72 hours.  Invalid input(s): FREET3  Cardiac Enzymes No results for input(s): CKMB, TROPONINI, MYOGLOBIN in the last 168 hours.  Invalid input(s): CK ------------------------------------------------------------------------------------------------------------------    Component Value Date/Time   BNP 156.3 (H) 06/04/2020 0107    Micro Results Recent Results (from the past 240 hour(s))  Covid-19, Flu A+B ([facility])     Status: None   Collection Time: 06/01/20 12:54 PM   Specimen: Nasopharyngeal   Naso  Result Value Ref Range Status   SARS-CoV-2, NAA Not Detected Not Detected Final    Comment: This nucleic acid amplification test was developed and its performance characteristics determined by [employer]. Nucleic acid amplification tests include RT-PCR and TMA. This test has not been FDA cleared or approved. This test has been authorized by FDA under an Emergency Use Authorization (EUA). This test is only authorized for the duration of time the declaration that circumstances exist justifying the authorization of the emergency use of in vitro diagnostic tests for detection of SARS-CoV-2 virus and/or diagnosis of COVID-19 infection under section 564(b)(1) of the Act, 21 U.S.C. 509TOI-7(T) (1),  unless the authorization is terminated or revoked sooner. When diagnostic testing is negative, the possibility of a false negative result should be considered in the context of a patient's recent exposures and the presence of clinical signs and symptoms consistent with COVID-19. An individual without symptoms of COVID-19 and who is not shedding SARS-CoV-2 virus wo uld expect to have a negative (not detected) result in this assay.    Influenza A, NAA Not Detected Not Detected Final   Influenza B, NAA Not Detected Not Detected Final  Resp Panel by RT-PCR (Flu A&B, Covid) Nasopharyngeal Swab  Status: Abnormal   Collection Time: 06/01/20  3:35 PM   Specimen: Nasopharyngeal Swab; Nasopharyngeal(NP) swabs in vial transport medium  Result Value Ref Range Status   SARS Coronavirus 2 by RT PCR POSITIVE (A) NEGATIVE Final    Comment: RESULT CALLED TO, READ BACK BY AND VERIFIED WITH: (NOTE) SARS-CoV-2 target nucleic acids are DETECTED.  The SARS-CoV-2 RNA is generally detectable in upper respiratory specimens during the acute phase of infection. Positive results are indicative of the presence of the identified virus, but do not rule out bacterial infection or co-infection with other pathogens not detected by the test. Clinical correlation with patient history and other diagnostic information is necessary to determine patient infection status. The expected result is Negative.  Fact Sheet for Patients: EntrepreneurPulse.com.au  Fact Sheet for Healthcare Providers: IncredibleEmployment.be  This test is not yet approved or cleared by the [place] FDA and  has been authorized for detection and/or diagnosis of SARS-CoV-2 by FDA under an Emergency Use Authorization (EUA).  This EUA will remain in effect (meaning this test can be used) for the duration of  the [place] VID-19 declaration under Section 564(b)(1) of the Act, 21 U.S.C. section 360bbb-3(b)(1),  unless the authorization is terminated or revoked sooner.     Influenza A by PCR NEGATIVE NEGATIVE Final    Comment: A,OLEARY @1735  06/01/20 EB   Influenza B by PCR NEGATIVE NEGATIVE Final    Comment: (NOTE) The Xpert Xpress SARS-CoV-2/FLU/RSV plus assay is intended as an aid in the diagnosis of influenza from Nasopharyngeal swab specimens and should not be used as a sole basis for treatment. Nasal washings and aspirates are unacceptable for Xpert Xpress SARS-CoV-2/FLU/RSV testing.  Fact Sheet for Patients: EntrepreneurPulse.com.au  Fact Sheet for Healthcare Providers: IncredibleEmployment.be  This test is not yet approved or cleared by the [place] FDA and has been authorized for detection and/or diagnosis of SARS-CoV-2 by FDA under an Emergency Use Authorization (EUA). This EUA will remain in effect (meaning this test can be used) for the duration of the COVID-19 declaration under Section 564(b)(1) of the Act, 21 U.S.C. section 360bbb-3(b)(1), unless the authorization is terminated or revoked.  Performed at [facility], [place] [address] [place], [place] [address]   Blood culture (routine x 2)     Status: Abnormal   Collection Time: 06/01/20  4:00 PM   Specimen: BLOOD RIGHT FOREARM  Result Value Ref Range Status   Specimen Description BLOOD RIGHT FOREARM  Final   Special Requests   Final    BOTTLES DRAWN AEROBIC AND ANAEROBIC Blood Culture adequate volume   Culture  Setup Time   Final    GRAM POSITIVE COCCI IN CLUSTERS IN BOTH AEROBIC AND ANAEROBIC BOTTLES Organism ID to follow CRITICAL RESULT CALLED TO, READ BACK BY AND VERIFIED WITHSalli Real, AT 0603 06/02/20 [facility] [place] Performed at [facility], [place] [address], [place], [place] [address]    Culture STAPHYLOCOCCUS AUREUS (A)  Final   Report Status 06/04/2020 FINAL  Final   Organism ID, Bacteria STAPHYLOCOCCUS AUREUS  Final      Susceptibility    Staphylococcus aureus - MIC*    CIPROFLOXACIN <=0.5 SENSITIVE Sensitive     ERYTHROMYCIN <=0.25 SENSITIVE Sensitive     GENTAMICIN <=0.5 SENSITIVE Sensitive     OXACILLIN <=0.25 SENSITIVE Sensitive     TETRACYCLINE <=1 SENSITIVE Sensitive     VANCOMYCIN 1 SENSITIVE Sensitive     TRIMETH/SULFA <=10 SENSITIVE Sensitive     CLINDAMYCIN <=  0.25 SENSITIVE Sensitive     RIFAMPIN <=0.5 SENSITIVE Sensitive     Inducible Clindamycin NEGATIVE Sensitive     * STAPHYLOCOCCUS AUREUS  Blood Culture ID Panel (Reflexed)     Status: Abnormal   Collection Time: 06/01/20  4:00 PM  Result Value Ref Range Status   Enterococcus faecalis NOT DETECTED NOT DETECTED Final   Enterococcus Faecium NOT DETECTED NOT DETECTED Final   Listeria monocytogenes NOT DETECTED NOT DETECTED Final   Staphylococcus species DETECTED (A) NOT DETECTED Final    Comment: CRITICAL RESULT CALLED TO, READ BACK BY AND VERIFIED WITH: G. ABBOTT PHARMD, AT 0603 06/02/20 D. VANHOOK    Staphylococcus aureus (BCID) DETECTED (A) NOT DETECTED Final    Comment: CRITICAL RESULT CALLED TO, READ BACK BY AND VERIFIED WITH: G. ABBOTT PHARMD, AT 0603 06/02/20 D. VANHOOK    Staphylococcus epidermidis NOT DETECTED NOT DETECTED Final   Staphylococcus lugdunensis NOT DETECTED NOT DETECTED Final   Streptococcus species NOT DETECTED NOT DETECTED Final   Streptococcus agalactiae NOT DETECTED NOT DETECTED Final   Streptococcus pneumoniae NOT DETECTED NOT DETECTED Final   Streptococcus pyogenes NOT DETECTED NOT DETECTED Final   A.calcoaceticus-baumannii NOT DETECTED NOT DETECTED Final   Bacteroides fragilis NOT DETECTED NOT DETECTED Final   Enterobacterales NOT DETECTED NOT DETECTED Final   Enterobacter cloacae complex NOT DETECTED NOT DETECTED Final   Escherichia coli NOT DETECTED NOT DETECTED Final   Klebsiella aerogenes NOT DETECTED NOT DETECTED Final   Klebsiella oxytoca NOT DETECTED NOT DETECTED Final   Klebsiella pneumoniae NOT DETECTED NOT DETECTED  Final   Proteus species NOT DETECTED NOT DETECTED Final   Salmonella species NOT DETECTED NOT DETECTED Final   Serratia marcescens NOT DETECTED NOT DETECTED Final   Haemophilus influenzae NOT DETECTED NOT DETECTED Final   Neisseria meningitidis NOT DETECTED NOT DETECTED Final   Pseudomonas aeruginosa NOT DETECTED NOT DETECTED Final   Stenotrophomonas maltophilia NOT DETECTED NOT DETECTED Final   Candida albicans NOT DETECTED NOT DETECTED Final   Candida auris NOT DETECTED NOT DETECTED Final   Candida glabrata NOT DETECTED NOT DETECTED Final   Candida krusei NOT DETECTED NOT DETECTED Final   Candida parapsilosis NOT DETECTED NOT DETECTED Final   Candida tropicalis NOT DETECTED NOT DETECTED Final   Cryptococcus neoformans/gattii NOT DETECTED NOT DETECTED Final   Meth resistant mecA/C and MREJ NOT DETECTED NOT DETECTED Final    Comment: Performed at [facility], [address], [place], [place] [address]  Blood culture (routine x 2)     Status: Abnormal   Collection Time: 06/01/20  4:02 PM   Specimen: BLOOD LEFT FOREARM  Result Value Ref Range Status   Specimen Description BLOOD LEFT FOREARM  Final   Special Requests   Final    BOTTLES DRAWN AEROBIC AND ANAEROBIC Blood Culture adequate volume   Culture  Setup Time   Final    GRAM POSITIVE COCCI IN CLUSTERS IN BOTH AEROBIC AND ANAEROBIC BOTTLES    Culture (A)  Final    STAPHYLOCOCCUS AUREUS SUSCEPTIBILITIES PERFORMED ON PREVIOUS CULTURE WITHIN THE LAST 5 DAYS. Performed at [facility], [place] [address], [place],  [address]    Report Status 06/04/2020 FINAL  Final  Culture, blood (routine x 2)     Status: None (Preliminary result)   Collection Time: 06/03/20  9:13 AM   Specimen: BLOOD  Result Value Ref Range Status   Specimen Description BLOOD LEFT ANTECUBITAL  Final   Special Requests   Final  BOTTLES DRAWN AEROBIC AND ANAEROBIC Blood Culture adequate volume   Culture   Final    NO GROWTH < 24  HOURS Performed at [facility], [place] [address], [place], [place] [address]    Report Status PENDING  Incomplete  Culture, blood (routine x 2)     Status: None (Preliminary result)   Collection Time: 06/03/20  9:14 AM   Specimen: BLOOD  Result Value Ref Range Status   Specimen Description BLOOD BLOOD LEFT HAND  Final   Special Requests   Final    BOTTLES DRAWN AEROBIC AND ANAEROBIC Blood Culture adequate volume   Culture   Final    NO GROWTH < 24 HOURS Performed at [facility], [place] [address] [place], [place] [address]    Report Status PENDING  Incomplete    Radiology Reports DG Chest 2 View  Result Date: 06/01/2020 CLINICAL DATA:  Shortness of breath EXAM: CHEST - 2 VIEW COMPARISON:  06/01/2020 prior studies FINDINGS: The cardiomediastinal silhouette is unremarkable. Minimal pulmonary vascular congestion present. There is no evidence of focal airspace disease, pulmonary edema, suspicious pulmonary nodule/mass, pleural effusion, or pneumothorax. No acute bony abnormalities are identified. IMPRESSION: Minimal pulmonary vascular congestion. Electronically Signed   By: Margarette Canada M.D.   On: 06/01/2020 14:59   DG Chest 2 View  Result Date: 06/01/2020 CLINICAL DATA:  Cough and shortness of breath EXAM: CHEST - 2 VIEW COMPARISON:  05/29/20 FINDINGS: The heart size and mediastinal contours are within normal limits. Both lungs are clear. Increased pulmonary vascular congestion without overt edema. No pleural effusion. The visualized skeletal structures are unremarkable. IMPRESSION: Increased pulmonary vascular congestion without overt edema. Electronically Signed   By: Kerby Moors M.D.   On: 06/01/2020 13:12   DG Chest 2 View  Result Date: 05/29/2020 CLINICAL DATA:  Chest pain and shortness of breath beginning yesterday. Intermittent hemoptysis. EXAM: CHEST - 2 VIEW COMPARISON:  01/04/2015 FINDINGS: The heart size and mediastinal contours are within normal limits. Both lungs are  clear. The visualized skeletal structures are unremarkable. IMPRESSION: No active cardiopulmonary disease. Electronically Signed   By: Marlaine Hind M.D.   On: 05/29/2020 13:48   CT Angio Chest PE W/Cm &/Or Wo Cm  Result Date: 05/29/2020 CLINICAL DATA:  Chest pain. EXAM: CT ANGIOGRAPHY CHEST WITH CONTRAST TECHNIQUE: Multidetector CT imaging of the chest was performed using the standard protocol during bolus administration of intravenous contrast. Multiplanar CT image reconstructions and MIPs were obtained to evaluate the vascular anatomy. CONTRAST:  172mL OMNIPAQUE IOHEXOL 350 MG/ML SOLN COMPARISON:  January 04, 2015. FINDINGS: Cardiovascular: Satisfactory opacification of the pulmonary arteries to the segmental level. No evidence of pulmonary embolism. Normal heart size. No pericardial effusion. Mediastinum/Nodes: No enlarged mediastinal, hilar, or axillary lymph nodes. Thyroid gland, trachea, and esophagus demonstrate no significant findings. Lungs/Pleura: No pneumothorax or pleural effusion is noted. 5 mm nodule is noted in left lower lobe best seen on image number 53 of series 5. 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 Mild splenomegaly is noted. Musculoskeletal: No chest wall abnormality. No acute or significant osseous findings. Review of the MIP images confirms the above findings. IMPRESSION: 1. No definite evidence of pulmonary embolus. 2. 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 Mild splenomegaly is noted. 3. 5 mm left lower lobe nodule is noted. No follow-up needed if patient is low-risk. Non-contrast chest CT can be considered in 12 months if patient is high-risk. This recommendation follows the consensus statement: Guidelines for Management of Incidental  Pulmonary Nodules Detected on CT Images: From the Fleischner Society 2017; Radiology 2017; [id number]. Electronically Signed   By: Marijo Conception M.D.   On: 05/29/2020 15:52   US Venous Img Lower Unilateral Left  Result Date:  05/29/2020 CLINICAL DATA:  Left leg pain EXAM: LEFT LOWER EXTREMITY VENOUS DOPPLER ULTRASOUND TECHNIQUE: Gray-scale sonography with graded compression, as well as color Doppler and duplex ultrasound were performed to evaluate the lower extremity deep venous systems from the level of the common femoral vein and including the common femoral, femoral, profunda femoral, popliteal and calf veins including the posterior tibial, peroneal and gastrocnemius veins when visible. The superficial great saphenous vein was also interrogated. Spectral Doppler was utilized to evaluate flow at rest and with distal augmentation maneuvers in the common femoral, femoral and popliteal veins. COMPARISON:  None. FINDINGS: Contralateral Common Femoral Vein: Respiratory phasicity is normal and symmetric with the symptomatic side. No evidence of thrombus. Normal compressibility. Common Femoral Vein: No evidence of thrombus. Normal compressibility, respiratory phasicity and response to augmentation. Saphenofemoral Junction: No evidence of thrombus. Normal compressibility and flow on color Doppler imaging. Profunda Femoral Vein: No evidence of thrombus. Normal compressibility and flow on color Doppler imaging. Femoral Vein: No evidence of thrombus. Normal compressibility, respiratory phasicity and response to augmentation. Popliteal Vein: No evidence of thrombus. Normal compressibility, respiratory phasicity and response to augmentation. Calf Veins: No evidence of thrombus. Normal compressibility and flow on color Doppler imaging. Superficial Great Saphenous Vein: No evidence of thrombus. Normal compressibility. Venous Reflux:  None. Other Findings:  None. IMPRESSION: No evidence of deep venous thrombosis. Electronically Signed   By: Inez Catalina M.D.   On: 05/29/2020 15:57   DG Chest Port 1 View  Result Date: 06/02/2020 CLINICAL DATA:  60 year old male with shortness of breath, history of COVID 19. EXAM: PORTABLE CHEST - 1 VIEW COMPARISON:   06/01/2020 FINDINGS: The mediastinal contours are within normal limits. No cardiomegaly. The lungs are clear bilaterally without evidence of focal consolidation, pleural effusion, or pneumothorax. No acute osseous abnormality. IMPRESSION: No acute cardiopulmonary process. Electronically Signed   By: Ruthann Cancer MD   On: 06/02/2020 08:47   ECHOCARDIOGRAM COMPLETE  Result Date: 06/03/2020    ECHOCARDIOGRAM REPORT   Patient Name:   ANIS DEGIDIO Date of Exam: 06/03/2020 Medical Rec #:  [id number]        Height:       74.0 in Accession #:    [id number]       Weight:       261.4 lb Date of Birth:  September 25, 1960        BSA:          2.435 m Patient Age:    78 years         BP:           118/62 mmHg Patient Gender: M                HR:           99 bpm. Exam Location:  Inpatient Procedure: 2D Echo Indications:    Bacteremia R78.81  History:        Patient has no prior history of Echocardiogram examinations.                 Risk Factors:Diabetes, Dyslipidemia and Former Smoker. COVID 19,                 Staph aureus bactermia.  Sonographer:    Leavy Cella  Referring Phys: [place]  1. Left ventricular ejection fraction, by estimation, is 60 to 65%. The left ventricle has normal function. The left ventricle has no regional wall motion abnormalities. Left ventricular diastolic function could not be evaluated.  2. Right ventricular systolic function is normal. The right ventricular size is normal.  3. The mitral valve is normal in structure. No evidence of mitral valve regurgitation. No evidence of mitral stenosis.  4. The aortic valve is tricuspid. Aortic valve regurgitation is not visualized. No aortic stenosis is present. FINDINGS  Left Ventricle: Left ventricular ejection fraction, by estimation, is 60 to 65%. The left ventricle has normal function. The left ventricle has no regional wall motion abnormalities. The left ventricular internal cavity size was normal in size. There is  no left  ventricular hypertrophy. Left ventricular diastolic function could not be evaluated. Right Ventricle: The right ventricular size is normal. No increase in right ventricular wall thickness. Right ventricular systolic function is normal. Left Atrium: Left atrial size was normal in size. Right Atrium: Right atrial size was normal in size. Pericardium: There is no evidence of pericardial effusion. Mitral Valve: The mitral valve is normal in structure. Mild mitral annular calcification. No evidence of mitral valve regurgitation. No evidence of mitral valve stenosis. Tricuspid Valve: The tricuspid valve is normal in structure. Tricuspid valve regurgitation is not demonstrated. No evidence of tricuspid stenosis. Aortic Valve: The aortic valve is tricuspid. Aortic valve regurgitation is not visualized. No aortic stenosis is present. Pulmonic Valve: The pulmonic valve was normal in structure. Pulmonic valve regurgitation is not visualized. No evidence of pulmonic stenosis. Aorta: The aortic root is normal in size and structure. Venous: The inferior vena cava was not well visualized. IAS/Shunts: No atrial level shunt detected by color flow Doppler. Skeet Latch MD Electronically signed by Skeet Latch MD Signature Date/Time: 06/03/2020/6:27:37 PM    Final

## 2020-06-05 ENCOUNTER — Inpatient Hospital Stay: Payer: Self-pay

## 2020-06-05 LAB — CBC WITH DIFFERENTIAL/PLATELET
Abs Immature Granulocytes: 0.03 10*3/uL (ref 0.00–0.07)
Basophils Absolute: 0 10*3/uL (ref 0.0–0.1)
Basophils Relative: 0 %
Eosinophils Absolute: 0 10*3/uL (ref 0.0–0.5)
Eosinophils Relative: 0 %
HCT: 32.2 % — ABNORMAL LOW (ref 39.0–52.0)
Hemoglobin: 10.9 g/dL — ABNORMAL LOW (ref 13.0–17.0)
Immature Granulocytes: 0 %
Lymphocytes Relative: 17 %
Lymphs Abs: 1.1 10*3/uL (ref 0.7–4.0)
MCH: 34.6 pg — ABNORMAL HIGH (ref 26.0–34.0)
MCHC: 33.9 g/dL (ref 30.0–36.0)
MCV: 102.2 fL — ABNORMAL HIGH (ref 80.0–100.0)
Monocytes Absolute: 0.6 10*3/uL (ref 0.1–1.0)
Monocytes Relative: 9 %
Neutro Abs: 4.9 10*3/uL (ref 1.7–7.7)
Neutrophils Relative %: 74 %
Platelets: 150 10*3/uL (ref 150–400)
RBC: 3.15 MIL/uL — ABNORMAL LOW (ref 4.22–5.81)
RDW: 12.8 % (ref 11.5–15.5)
WBC: 6.7 10*3/uL (ref 4.0–10.5)
nRBC: 0 % (ref 0.0–0.2)

## 2020-06-05 LAB — TYPE AND SCREEN
ABO/RH(D): O POS
Antibody Screen: POSITIVE
Donor AG Type: NEGATIVE
Donor AG Type: NEGATIVE
Unit division: 0
Unit division: 0

## 2020-06-05 LAB — BPAM RBC
Blood Product Expiration Date: 202204032359
Blood Product Expiration Date: 202204112359
ISSUE DATE / TIME: 202203300951
Unit Type and Rh: 5100
Unit Type and Rh: 5100

## 2020-06-05 LAB — COMPREHENSIVE METABOLIC PANEL
ALT: 41 U/L (ref 0–44)
AST: 71 U/L — ABNORMAL HIGH (ref 15–41)
Albumin: 2.1 g/dL — ABNORMAL LOW (ref 3.5–5.0)
Alkaline Phosphatase: 86 U/L (ref 38–126)
Anion gap: 4 — ABNORMAL LOW (ref 5–15)
BUN: 20 mg/dL (ref 8–23)
CO2: 31 mmol/L (ref 22–32)
Calcium: 8.1 mg/dL — ABNORMAL LOW (ref 8.9–10.3)
Chloride: 102 mmol/L (ref 98–111)
Creatinine, Ser: 0.83 mg/dL (ref 0.61–1.24)
GFR, Estimated: >60 mL/min (ref >60)
Glucose, Bld: 141 mg/dL — ABNORMAL HIGH (ref 70–99)
Potassium: 3.4 mmol/L — ABNORMAL LOW (ref 3.5–5.1)
Sodium: 137 mmol/L (ref 135–145)
Total Bilirubin: 0.7 mg/dL (ref 0.3–1.2)
Total Protein: 5.6 g/dL — ABNORMAL LOW (ref 6.5–8.1)

## 2020-06-05 LAB — C-REACTIVE PROTEIN: CRP: 6.5 mg/dL — ABNORMAL HIGH (ref <1.0)

## 2020-06-05 LAB — GLUCOSE, CAPILLARY
Glucose-Capillary: 123 mg/dL — ABNORMAL HIGH (ref 70–99)
Glucose-Capillary: 175 mg/dL — ABNORMAL HIGH (ref 70–99)
Glucose-Capillary: 241 mg/dL — ABNORMAL HIGH (ref 70–99)
Glucose-Capillary: 235 mg/dL — ABNORMAL HIGH (ref 70–99)

## 2020-06-05 LAB — PROCALCITONIN: Procalcitonin: 0.19 ng/mL

## 2020-06-05 LAB — D-DIMER, QUANTITATIVE: D-Dimer, Quant: 1.33 ug/mL-FEU — ABNORMAL HIGH (ref 0.00–0.50)

## 2020-06-05 LAB — MAGNESIUM: Magnesium: 1.9 mg/dL (ref 1.7–2.4)

## 2020-06-05 LAB — BRAIN NATRIURETIC PEPTIDE: B Natriuretic Peptide: 112.6 pg/mL — ABNORMAL HIGH (ref 0.0–100.0)

## 2020-06-05 MED ORDER — POTASSIUM CHLORIDE CRYS ER 20 MEQ PO TBCR
40.0000 meq | EXTENDED_RELEASE_TABLET | Freq: Once | ORAL | Status: AC
  Administered 2020-06-05: 40 meq via ORAL
  Filled 2020-06-05: qty 2

## 2020-06-05 MED ORDER — METHYLPREDNISOLONE SODIUM SUCC 40 MG IJ SOLR
20.0000 mg | INTRAMUSCULAR | Status: AC
  Administered 2020-06-05 – 2020-06-06 (×2): 20 mg via INTRAVENOUS
  Filled 2020-06-05 (×2): qty 1

## 2020-06-05 MED ORDER — INSULIN GLARGINE 100 UNIT/ML ~~LOC~~ SOLN
18.0000 [IU] | Freq: Every day | SUBCUTANEOUS | Status: DC
  Administered 2020-06-05: 18 [IU] via SUBCUTANEOUS
  Filled 2020-06-05 (×2): qty 0.18

## 2020-06-05 MED ORDER — SODIUM CHLORIDE 0.9 % IV SOLN: INTRAVENOUS | Status: DC

## 2020-06-05 MED ORDER — INSULIN ASPART 100 UNIT/ML ~~LOC~~ SOLN
2.0000 [IU] | Freq: Three times a day (TID) | SUBCUTANEOUS | Status: DC
  Administered 2020-06-05 (×3): 2 [IU] via SUBCUTANEOUS

## 2020-06-05 NOTE — Progress Notes (Signed)
    [facility] has been requested to perform a transesophageal echocardiogram on Travis Hampton for bacteremia.  After careful review of history and examination, the risks and benefits of transesophageal echocardiogram have been explained including risks of esophageal damage, perforation (1:10,000 risk), bleeding, pharyngeal hematoma as well as other potential complications associated with conscious sedation including aspiration, arrhythmia, respiratory failure and death. Alternatives to treatment were discussed, questions were answered. Patient is willing to proceed.   Pt scheduled tomorrow 06/06/20 at 2pm with Dr. Harrington Challenger. NPO at [place] please.  Tami Lin Roney Youtz, [place]  06/05/2020 3:48 PM

## 2020-06-05 NOTE — Progress Notes (Signed)
PROGRESS NOTE                                                                                                                                                                                                             Patient Demographics:    Travis Hampton, is a 60 y.o. male, DOB - [birth year], VWP:[id number]  Outpatient Primary MD for the patient is Reynold Bowen, MD    LOS - 4  Admit date - 06/01/2020    Chief Complaint  Patient presents with  . Shortness of Breath       Brief Narrative (HPI from H&P) - Travis Hampton is a 60 y.o. 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   Subjective:   Patient in bed, appears comfortable, denies any headache, no fever, no chest pain or pressure, no shortness of breath , no abdominal pain. No new focal weakness.    Assessment  & Plan :     1. Acute Hypoxic Resp. Failure due to Acute Covid 19 Viral Pneumonitis during the ongoing 2020 Covid 19 Pandemic - he is partially vaccinated with 2 mRNA shots, he has been started on IV steroids & Remdesivir. Encouraged the patient to sit up in chair in the daytime use I-S and flutter valve for pulmonary toiletry.  Will advance activity and titrate down oxygen as possible.  Clinically much better start tapering steroids.   SpO2: 94 %  Recent Labs  Lab 06/01/20 1432 06/01/20 1535 06/01/20 1654 06/01/20 1913 06/02/20 0246 06/02/20 1022 06/02/20 1023 06/03/20 0143 06/04/20 0107 06/05/20 0119  WBC 10.2  --   --   --  7.5  --   --  9.4 13.3* 6.7  HGB 12.7*  --   --   --  11.0*  --   --  10.9* 12.4* 10.9*  HCT 38.3*  --   --   --  32.5*  --   --  32.0* 36.7* 32.2*  PLT 154  --   --   --  106*  --   --  132* 196 150  CRP  --   --   --  18.6* 17.6*  --    --  13.8* 10.6* 6.5*  BNP 190.2*  --   --  197.8*  --  4,243.4*  --  172.5* 156.3* 112.6*  DDIMER  --   --   --  2.15* 1.94*  --   --  1.57* 1.82* 1.33*  PROCALCITON  --   --   --  0.92  --   --  0.70 0.50 0.35 0.19  AST  --   --   --  39 30  --   --  32 60* 71*  ALT  --   --   --  26 25  --   --  24 35 41  ALKPHOS  --   --   --  100 90  --   --  93 105 86  BILITOT  --   --   --  2.4* 1.5*  --   --  1.2 1.3* 0.7  ALBUMIN  --   --   --  2.6* 2.3*  --   --  2.2* 2.5* 2.1*  INR  --   --   --  1.5*  --   --   --   --   --   --   LATICACIDVEN  --   --  2.1* 2.9*  --   --   --   --   --   --   SARSCOV2NAA  --  POSITIVE*  --   --   --   --   --   --   --   --       2. MSSA bacteremia.-  D following currently on empiric IV antibiotics ,? Source, does have a few scabs on his R.leg.  Repeat blood cultures drawn on 06/03/2020 - thus far transthoracic echocardiogram stable, undergoing TEE on 06/05/2020.  By ID if TEE is stable then oral antibiotics.  3.  Dyslipidemia.  On statin continue.  4.  Elevated lactic acid, hydrate, hold Glucophage.    5.  Hypokalemia.  Replaced.    6. DM 2 -poor outpatient control due to hyperglycemia: Placed on Lantus along with sliding scale, DM and insulin education while he is here.  Steroids being rapidly tapered, monitor closely.  Lab Results  Component Value Date   HGBA1C 10.9 (H) 06/02/2020   CBG (last 3)  Recent Labs    06/04/20 1648 06/04/20 2031 06/05/20 0815  GLUCAP 268* 210* 123*         Condition - Fair  Family Communication  : Sherryll Burger [phone] 06/02/20  Code Status :  Full  Consults  :  None  PUD Prophylaxis : None   Procedures  :     TTE -  1. Left ventricular ejection fraction, by estimation, is 60 to 65%. The left ventricle has normal function. The left ventricle has no regional wall motion abnormalities. Left ventricular diastolic function could not be evaluated.  2. Right ventricular systolic function is normal. The right  ventricular size is normal.  3. The mitral valve is normal in structure. No evidence of mitral valve regurgitation. No evidence of mitral stenosis.  4. The aortic valve is tricuspid. Aortic valve regurgitation is not visualized. No aortic stenosis is present.   TEE        Disposition Plan  :    Status is: Inpatient  Remains inpatient appropriate because:IV treatments appropriate due to intensity of illness or inability to take PO   Dispo: The patient is from: Home  Anticipated d/c is to: Home              Patient currently is not medically stable to d/c.   Difficult to place patient No  DVT Prophylaxis  :  Lovenox    Lab Results  Component Value Date   PLT 150 06/05/2020    Diet :  Diet Order            Diet heart healthy/carb modified Room service appropriate? Yes; Fluid consistency: Thin  Diet effective now                  Inpatient Medications  Scheduled Meds: . atorvastatin  20 mg Oral Daily  . enoxaparin (LOVENOX) injection  40 mg Subcutaneous Q24H  . insulin aspart  0-20 Units Subcutaneous TID WC  . insulin aspart  0-5 Units Subcutaneous QHS  . insulin aspart  2 Units Subcutaneous TID WC  . insulin glargine  18 Units Subcutaneous Daily  . methylPREDNISolone (SOLU-MEDROL) injection  20 mg Intravenous Q24H   Continuous Infusions: .  ceFAZolin (ANCEF) IV Stopped (06/05/20 2355)   PRN Meds:.acetaminophen, guaiFENesin-dextromethorphan, Ipratropium-Albuterol, senna-docusate  Antibiotics  :    Anti-infectives (From admission, onward)   Start     Dose/Rate Route Frequency Ordered Stop   06/02/20 1745  ceFAZolin (ANCEF) IVPB 2g/100 mL premix        2 g 200 mL/hr over 30 Minutes Intravenous Every 8 hours 06/02/20 1710     06/02/20 1145  levofloxacin (LEVAQUIN) tablet 500 mg  Status:  Discontinued        500 mg Oral Daily 06/02/20 1048 06/02/20 1713   06/02/20 1000  remdesivir 100 mg in sodium chloride 0.9 % 100 mL IVPB       "Followed by" Linked  Group Details   100 mg 200 mL/hr over 30 Minutes Intravenous Daily 06/01/20 1816 06/05/20 0914   06/02/20 0645  ceFAZolin (ANCEF) IVPB 2g/100 mL premix  Status:  Discontinued        2 g 200 mL/hr over 30 Minutes Intravenous Every 8 hours 06/02/20 0613 06/02/20 1044   06/01/20 1815  remdesivir 200 mg in sodium chloride 0.9% 250 mL IVPB       "Followed by" Linked Group Details   200 mg 580 mL/hr over 30 Minutes Intravenous Once 06/01/20 1816 06/01/20 2013   06/01/20 1515  cefTRIAXone (ROCEPHIN) 1 g in sodium chloride 0.9 % 100 mL IVPB        1 g 200 mL/hr over 30 Minutes Intravenous  Once 06/01/20 1508 06/01/20 1642   06/01/20 1515  azithromycin (ZITHROMAX) 500 mg in sodium chloride 0.9 % 250 mL IVPB        500 mg 250 mL/hr over 60 Minutes Intravenous  Once 06/01/20 1508 06/01/20 1757       Time Spent in minutes  30   Lala Lund M.D on 06/05/2020 at 10:09 AM  To page go to [email or web address]   [facility] -  Office  [phone]    See all Orders from today for further details    Objective:   Vitals:   06/04/20 0520 06/04/20 1404 06/04/20 2000 06/05/20 0519  BP: 115/61 (!) 113/55 121/65 117/60  Pulse: 67 92 77 70  Resp: 15 17 (!) 21 14  Temp: 98.1 F (36.7 C) 98 F (36.7 C) 98.2 F (36.8 C) 98.2 F (36.8 C)  TempSrc: Oral Oral Oral Oral  SpO2: 92% 94% 95% 94%  Weight:      Height:  Wt Readings from Last 3 Encounters:  06/02/20 118.6 kg  05/29/20 115.7 kg  10/14/15 134.3 kg     Intake/Output Summary (Last 24 hours) at 06/05/2020 1009 Last data filed at 06/05/2020 1008 Gross per 24 hour  Intake 1427.03 ml  Output 3075 ml  Net -1647.97 ml     Physical Exam  Awake Alert, No new F.N deficits, Normal affect [place].AT,PERRAL Supple Neck,No JVD, No cervical lymphadenopathy appriciated.  Symmetrical Chest wall movement, Good air movement bilaterally, CTAB RRR,No Gallops, Rubs or new Murmurs, No Parasternal Heave +ve B.Sounds, Abd Soft, No tenderness,  No organomegaly appriciated, No rebound - guarding or rigidity. No Cyanosis, Clubbing or edema, No new Rash or bruise    Data Review:    CBC Recent Labs  Lab 06/01/20 1432 06/02/20 0246 06/03/20 0143 06/04/20 0107 06/05/20 0119  WBC 10.2 7.5 9.4 13.3* 6.7  HGB 12.7* 11.0* 10.9* 12.4* 10.9*  HCT 38.3* 32.5* 32.0* 36.7* 32.2*  PLT 154 106* 132* 196 150  MCV 102.1* 100.3* 100.9* 101.7* 102.2*  MCH 33.9 34.0 34.4* 34.3* 34.6*  MCHC 33.2 33.8 34.1 33.8 33.9  RDW 12.7 12.7 12.7 12.7 12.8  LYMPHSABS  --  0.4* 0.6* 1.7 1.1  MONOABS  --  0.2 0.3 1.0 0.6  EOSABS  --  0.0 0.0 0.0 0.0  BASOSABS  --  0.0 0.0 0.0 0.0    Recent Labs  Lab 06/01/20 1432 06/01/20 1654 06/01/20 1913 06/02/20 0246 06/02/20 1022 06/02/20 1023 06/03/20 0143 06/04/20 0107 06/05/20 0119  NA 131*  --   --  131*  --   --  132* 135 137  K 3.3*  --   --  4.1  --   --  4.4 3.7 3.4*  CL 93*  --   --  98  --   --  98 98 102  CO2 29  --   --  27  --   --  25 26 31   GLUCOSE 383*  --   --  460*  --   --  312* 177* 141*  BUN 11  --   --  15  --   --  25* 25* 20  CREATININE 1.00  --   --  0.93  --   --  0.79 0.82 0.83  CALCIUM 8.2*  --   --  7.8*  --   --  8.0* 8.7* 8.1*  AST  --   --  39 30  --   --  32 60* 71*  ALT  --   --  26 25  --   --  24 35 41  ALKPHOS  --   --  100 90  --   --  93 105 86  BILITOT  --   --  2.4* 1.5*  --   --  1.2 1.3* 0.7  ALBUMIN  --   --  2.6* 2.3*  --   --  2.2* 2.5* 2.1*  MG  --   --   --  1.8  --   --  1.9 2.0 1.9  CRP  --   --  18.6* 17.6*  --   --  13.8* 10.6* 6.5*  DDIMER  --   --  2.15* 1.94*  --   --  1.57* 1.82* 1.33*  PROCALCITON  --   --  0.92  --   --  0.70 0.50 0.35 0.19  LATICACIDVEN  --  2.1* 2.9*  --   --   --   --   --   --  INR  --   --  1.5*  --   --   --   --   --   --   HGBA1C  --   --   --  10.9*  --   --   --   --   --   BNP 190.2*  --  197.8*  --  4,243.4*  --  172.5* 156.3* 112.6*     ------------------------------------------------------------------------------------------------------------------ No results for input(s): CHOL, HDL, LDLCALC, TRIG, CHOLHDL, LDLDIRECT in the last 72 hours.  Lab Results  Component Value Date   HGBA1C 10.9 (H) 06/02/2020   ------------------------------------------------------------------------------------------------------------------ No results for input(s): TSH, T4TOTAL, T3FREE, THYROIDAB in the last 72 hours.  Invalid input(s): FREET3  Cardiac Enzymes No results for input(s): CKMB, TROPONINI, MYOGLOBIN in the last 168 hours.  Invalid input(s): CK ------------------------------------------------------------------------------------------------------------------    Component Value Date/Time   BNP 112.6 (H) 06/05/2020 0119    Micro Results Recent Results (from the past 240 hour(s))  Covid-19, Flu A+B ([facility])     Status: None   Collection Time: 06/01/20 12:54 PM   Specimen: Nasopharyngeal   Naso  Result Value Ref Range Status   SARS-CoV-2, NAA Not Detected Not Detected Final    Comment: This nucleic acid amplification test was developed and its performance characteristics determined by [employer]. Nucleic acid amplification tests include RT-PCR and TMA. This test has not been FDA cleared or approved. This test has been authorized by FDA under an Emergency Use Authorization (EUA). This test is only authorized for the duration of time the declaration that circumstances exist justifying the authorization of the emergency use of in vitro diagnostic tests for detection of SARS-CoV-2 virus and/or diagnosis of COVID-19 infection under section 564(b)(1) of the Act, 21 U.S.C. 030DTH-4(H) (1), unless the authorization is terminated or revoked sooner. When diagnostic testing is negative, the possibility of a false negative result should be considered in the context of a patient's recent exposures and the presence of  clinical signs and symptoms consistent with COVID-19. An individual without symptoms of COVID-19 and who is not shedding SARS-CoV-2 virus wo uld expect to have a negative (not detected) result in this assay.    Influenza A, NAA Not Detected Not Detected Final   Influenza B, NAA Not Detected Not Detected Final  Resp Panel by RT-PCR (Flu A&B, Covid) Nasopharyngeal Swab     Status: Abnormal   Collection Time: 06/01/20  3:35 PM   Specimen: Nasopharyngeal Swab; Nasopharyngeal(NP) swabs in vial transport medium  Result Value Ref Range Status   SARS Coronavirus 2 by RT PCR POSITIVE (A) NEGATIVE Final    Comment: RESULT CALLED TO, READ BACK BY AND VERIFIED WITH: (NOTE) SARS-CoV-2 target nucleic acids are DETECTED.  The SARS-CoV-2 RNA is generally detectable in upper respiratory specimens during the acute phase of infection. Positive results are indicative of the presence of the identified virus, but do not rule out bacterial infection or co-infection with other pathogens not detected by the test. Clinical correlation with patient history and other diagnostic information is necessary to determine patient infection status. The expected result is Negative.  Fact Sheet for Patients: EntrepreneurPulse.com.au  Fact Sheet for Healthcare Providers: IncredibleEmployment.be  This test is not yet approved or cleared by the [place] FDA and  has been authorized for detection and/or diagnosis of SARS-CoV-2 by FDA under an Emergency Use Authorization (EUA).  This EUA will remain in effect (meaning this test can be used) for the duration of  the [place] VID-19  declaration under Section 564(b)(1) of the Act, 21 U.S.C. section 360bbb-3(b)(1), unless the authorization is terminated or revoked sooner.     Influenza A by PCR NEGATIVE NEGATIVE Final    Comment: A,OLEARY @1735  06/01/20 EB   Influenza B by PCR NEGATIVE NEGATIVE Final    Comment: (NOTE) The Xpert Xpress  SARS-CoV-2/FLU/RSV plus assay is intended as an aid in the diagnosis of influenza from Nasopharyngeal swab specimens and should not be used as a sole basis for treatment. Nasal washings and aspirates are unacceptable for Xpert Xpress SARS-CoV-2/FLU/RSV testing.  Fact Sheet for Patients: EntrepreneurPulse.com.au  Fact Sheet for Healthcare Providers: IncredibleEmployment.be  This test is not yet approved or cleared by the [place] FDA and has been authorized for detection and/or diagnosis of SARS-CoV-2 by FDA under an Emergency Use Authorization (EUA). This EUA will remain in effect (meaning this test can be used) for the duration of the COVID-19 declaration under Section 564(b)(1) of the Act, 21 U.S.C. section 360bbb-3(b)(1), unless the authorization is terminated or revoked.  Performed at [facility], [place] [address], [place], [place] [address]   Blood culture (routine x 2)     Status: Abnormal   Collection Time: 06/01/20  4:00 PM   Specimen: BLOOD RIGHT FOREARM  Result Value Ref Range Status   Specimen Description BLOOD RIGHT FOREARM  Final   Special Requests   Final    BOTTLES DRAWN AEROBIC AND ANAEROBIC Blood Culture adequate volume   Culture  Setup Time   Final    GRAM POSITIVE COCCI IN CLUSTERS IN BOTH AEROBIC AND ANAEROBIC BOTTLES Organism ID to follow CRITICAL RESULT CALLED TO, READ BACK BY AND VERIFIED WITHSalli Real, AT 0603 06/02/20 [facility] [place] Performed at [facility], [place] [address], [place], [place] [address]    Culture STAPHYLOCOCCUS AUREUS (A)  Final   Report Status 06/04/2020 FINAL  Final   Organism ID, Bacteria STAPHYLOCOCCUS AUREUS  Final      Susceptibility   Staphylococcus aureus - MIC*    CIPROFLOXACIN <=0.5 SENSITIVE Sensitive     ERYTHROMYCIN <=0.25 SENSITIVE Sensitive     GENTAMICIN <=0.5 SENSITIVE Sensitive     OXACILLIN <=0.25 SENSITIVE Sensitive     TETRACYCLINE <=1 SENSITIVE Sensitive      VANCOMYCIN 1 SENSITIVE Sensitive     TRIMETH/SULFA <=10 SENSITIVE Sensitive     CLINDAMYCIN <=0.25 SENSITIVE Sensitive     RIFAMPIN <=0.5 SENSITIVE Sensitive     Inducible Clindamycin NEGATIVE Sensitive     * STAPHYLOCOCCUS AUREUS  Blood Culture ID Panel (Reflexed)     Status: Abnormal   Collection Time: 06/01/20  4:00 PM  Result Value Ref Range Status   Enterococcus faecalis NOT DETECTED NOT DETECTED Final   Enterococcus Faecium NOT DETECTED NOT DETECTED Final   Listeria monocytogenes NOT DETECTED NOT DETECTED Final   Staphylococcus species DETECTED (A) NOT DETECTED Final    Comment: CRITICAL RESULT CALLED TO, READ BACK BY AND VERIFIED WITH: G. ABBOTT PHARMD, AT 0603 06/02/20 D. VANHOOK    Staphylococcus aureus (BCID) DETECTED (A) NOT DETECTED Final    Comment: CRITICAL RESULT CALLED TO, READ BACK BY AND VERIFIED WITH: G. ABBOTT PHARMD, AT 0603 06/02/20 D. VANHOOK    Staphylococcus epidermidis NOT DETECTED NOT DETECTED Final   Staphylococcus lugdunensis NOT DETECTED NOT DETECTED Final   Streptococcus species NOT DETECTED NOT DETECTED Final   Streptococcus agalactiae NOT DETECTED NOT DETECTED Final   Streptococcus pneumoniae NOT DETECTED NOT DETECTED Final   Streptococcus  pyogenes NOT DETECTED NOT DETECTED Final   A.calcoaceticus-baumannii NOT DETECTED NOT DETECTED Final   Bacteroides fragilis NOT DETECTED NOT DETECTED Final   Enterobacterales NOT DETECTED NOT DETECTED Final   Enterobacter cloacae complex NOT DETECTED NOT DETECTED Final   Escherichia coli NOT DETECTED NOT DETECTED Final   Klebsiella aerogenes NOT DETECTED NOT DETECTED Final   Klebsiella oxytoca NOT DETECTED NOT DETECTED Final   Klebsiella pneumoniae NOT DETECTED NOT DETECTED Final   Proteus species NOT DETECTED NOT DETECTED Final   Salmonella species NOT DETECTED NOT DETECTED Final   Serratia marcescens NOT DETECTED NOT DETECTED Final   Haemophilus influenzae NOT DETECTED NOT DETECTED Final   Neisseria  meningitidis NOT DETECTED NOT DETECTED Final   Pseudomonas aeruginosa NOT DETECTED NOT DETECTED Final   Stenotrophomonas maltophilia NOT DETECTED NOT DETECTED Final   Candida albicans NOT DETECTED NOT DETECTED Final   Candida auris NOT DETECTED NOT DETECTED Final   Candida glabrata NOT DETECTED NOT DETECTED Final   Candida krusei NOT DETECTED NOT DETECTED Final   Candida parapsilosis NOT DETECTED NOT DETECTED Final   Candida tropicalis NOT DETECTED NOT DETECTED Final   Cryptococcus neoformans/gattii NOT DETECTED NOT DETECTED Final   Meth resistant mecA/C and MREJ NOT DETECTED NOT DETECTED Final    Comment: Performed at [facility], [address], [place], [place] [address]  Blood culture (routine x 2)     Status: Abnormal   Collection Time: 06/01/20  4:02 PM   Specimen: BLOOD LEFT FOREARM  Result Value Ref Range Status   Specimen Description BLOOD LEFT FOREARM  Final   Special Requests   Final    BOTTLES DRAWN AEROBIC AND ANAEROBIC Blood Culture adequate volume   Culture  Setup Time   Final    GRAM POSITIVE COCCI IN CLUSTERS IN BOTH AEROBIC AND ANAEROBIC BOTTLES    Culture (A)  Final    STAPHYLOCOCCUS AUREUS SUSCEPTIBILITIES PERFORMED ON PREVIOUS CULTURE WITHIN THE LAST 5 DAYS. Performed at [facility], [place] [address] [place], [place] [address]    Report Status 06/04/2020 FINAL  Final  Culture, blood (routine x 2)     Status: None (Preliminary result)   Collection Time: 06/03/20  9:13 AM   Specimen: BLOOD  Result Value Ref Range Status   Specimen Description BLOOD LEFT ANTECUBITAL  Final   Special Requests   Final    BOTTLES DRAWN AEROBIC AND ANAEROBIC Blood Culture adequate volume   Culture   Final    NO GROWTH 2 DAYS Performed at [facility], [place] [address] [place], [place] [address]    Report Status PENDING  Incomplete  Culture, blood (routine x 2)     Status: None (Preliminary result)   Collection Time: 06/03/20  9:14 AM   Specimen: BLOOD   Result Value Ref Range Status   Specimen Description BLOOD BLOOD LEFT HAND  Final   Special Requests   Final    BOTTLES DRAWN AEROBIC AND ANAEROBIC Blood Culture adequate volume   Culture   Final    NO GROWTH 2 DAYS Performed at [facility], [place] [address] [place], [place] [address]    Report Status PENDING  Incomplete    Radiology Reports DG Chest 2 View  Result Date: 06/01/2020 CLINICAL DATA:  Shortness of breath EXAM: CHEST - 2 VIEW COMPARISON:  06/01/2020 prior studies FINDINGS: The cardiomediastinal silhouette is unremarkable. Minimal pulmonary vascular congestion present. There is no evidence of focal airspace disease, pulmonary edema,  suspicious pulmonary nodule/mass, pleural effusion, or pneumothorax. No acute bony abnormalities are identified. IMPRESSION: Minimal pulmonary vascular congestion. Electronically Signed   By: Margarette Canada M.D.   On: 06/01/2020 14:59   DG Chest 2 View  Result Date: 06/01/2020 CLINICAL DATA:  Cough and shortness of breath EXAM: CHEST - 2 VIEW COMPARISON:  05/29/20 FINDINGS: The heart size and mediastinal contours are within normal limits. Both lungs are clear. Increased pulmonary vascular congestion without overt edema. No pleural effusion. The visualized skeletal structures are unremarkable. IMPRESSION: Increased pulmonary vascular congestion without overt edema. Electronically Signed   By: Kerby Moors M.D.   On: 06/01/2020 13:12   DG Chest 2 View  Result Date: 05/29/2020 CLINICAL DATA:  Chest pain and shortness of breath beginning yesterday. Intermittent hemoptysis. EXAM: CHEST - 2 VIEW COMPARISON:  01/04/2015 FINDINGS: The heart size and mediastinal contours are within normal limits. Both lungs are clear. The visualized skeletal structures are unremarkable. IMPRESSION: No active cardiopulmonary disease. Electronically Signed   By: Marlaine Hind M.D.   On: 05/29/2020 13:48   CT Angio Chest PE W/Cm &/Or Wo Cm  Result Date: 05/29/2020 CLINICAL  DATA:  Chest pain. EXAM: CT ANGIOGRAPHY CHEST WITH CONTRAST TECHNIQUE: Multidetector CT imaging of the chest was performed using the standard protocol during bolus administration of intravenous contrast. Multiplanar CT image reconstructions and MIPs were obtained to evaluate the vascular anatomy. CONTRAST:  150mL OMNIPAQUE IOHEXOL 350 MG/ML SOLN COMPARISON:  January 04, 2015. FINDINGS: Cardiovascular: Satisfactory opacification of the pulmonary arteries to the segmental level. No evidence of pulmonary embolism. Normal heart size. No pericardial effusion. Mediastinum/Nodes: No enlarged mediastinal, hilar, or axillary lymph nodes. Thyroid gland, trachea, and esophagus demonstrate no significant findings. Lungs/Pleura: No pneumothorax or pleural effusion is noted. 5 mm nodule is noted in left lower lobe best seen on image number 53 of series 5. 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 Mild splenomegaly is noted. Musculoskeletal: No chest wall abnormality. No acute or significant osseous findings. Review of the MIP images confirms the above findings. IMPRESSION: 1. No definite evidence of pulmonary embolus. 2. 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 Mild splenomegaly is noted. 3. 5 mm left lower lobe nodule is noted. No follow-up needed if patient is low-risk. Non-contrast chest CT can be considered in 12 months if patient is high-risk. This recommendation follows the consensus statement: Guidelines for Management of Incidental Pulmonary Nodules Detected on CT Images: From the Fleischner Society 2017; Radiology 2017; 284:228-243. Electronically Signed   By: Marijo Conception M.D.   On: 05/29/2020 15:52   US Venous Img Lower Unilateral Left  Result Date: 05/29/2020 CLINICAL DATA:  Left leg pain EXAM: LEFT LOWER EXTREMITY VENOUS DOPPLER ULTRASOUND TECHNIQUE: Gray-scale sonography with graded compression, as well as color Doppler and duplex ultrasound were performed to evaluate the lower extremity deep venous systems  from the level of the common femoral vein and including the common femoral, femoral, profunda femoral, popliteal and calf veins including the posterior tibial, peroneal and gastrocnemius veins when visible. The superficial great saphenous vein was also interrogated. Spectral Doppler was utilized to evaluate flow at rest and with distal augmentation maneuvers in the common femoral, femoral and popliteal veins. COMPARISON:  None. FINDINGS: Contralateral Common Femoral Vein: Respiratory phasicity is normal and symmetric with the symptomatic side. No evidence of thrombus. Normal compressibility. Common Femoral Vein: No evidence of thrombus. Normal compressibility, respiratory phasicity and response to augmentation. Saphenofemoral Junction: No evidence of thrombus. Normal compressibility and flow on color Doppler  imaging. Profunda Femoral Vein: No evidence of thrombus. Normal compressibility and flow on color Doppler imaging. Femoral Vein: No evidence of thrombus. Normal compressibility, respiratory phasicity and response to augmentation. Popliteal Vein: No evidence of thrombus. Normal compressibility, respiratory phasicity and response to augmentation. Calf Veins: No evidence of thrombus. Normal compressibility and flow on color Doppler imaging. Superficial Great Saphenous Vein: No evidence of thrombus. Normal compressibility. Venous Reflux:  None. Other Findings:  None. IMPRESSION: No evidence of deep venous thrombosis. Electronically Signed   By: Inez Catalina M.D.   On: 05/29/2020 15:57   DG Chest Port 1 View  Result Date: 06/02/2020 CLINICAL DATA:  60 year old male with shortness of breath, history of COVID 19. EXAM: PORTABLE CHEST - 1 VIEW COMPARISON:  06/01/2020 FINDINGS: The mediastinal contours are within normal limits. No cardiomegaly. The lungs are clear bilaterally without evidence of focal consolidation, pleural effusion, or pneumothorax. No acute osseous abnormality. IMPRESSION: No acute cardiopulmonary  process. Electronically Signed   By: Ruthann Cancer MD   On: 06/02/2020 08:47   ECHOCARDIOGRAM COMPLETE  Result Date: 06/03/2020    ECHOCARDIOGRAM REPORT   Patient Name:   MANPREET STREY Date of Exam: 06/03/2020 Medical Rec #:  [id number]        Height:       74.0 in Accession #:    [id number]       Weight:       261.4 lb Date of Birth:  1960-09-05        BSA:          2.435 m Patient Age:    60 years         BP:           118/62 mmHg Patient Gender: M                HR:           99 bpm. Exam Location:  Inpatient Procedure: 2D Echo Indications:    Bacteremia R78.81  History:        Patient has no prior history of Echocardiogram examinations.                 Risk Factors:Diabetes, Dyslipidemia and Former Smoker. COVID 19,                 Staph aureus bactermia.  Sonographer:    Leavy Cella Referring Phys: [place]  1. Left ventricular ejection fraction, by estimation, is 60 to 65%. The left ventricle has normal function. The left ventricle has no regional wall motion abnormalities. Left ventricular diastolic function could not be evaluated.  2. Right ventricular systolic function is normal. The right ventricular size is normal.  3. The mitral valve is normal in structure. No evidence of mitral valve regurgitation. No evidence of mitral stenosis.  4. The aortic valve is tricuspid. Aortic valve regurgitation is not visualized. No aortic stenosis is present. FINDINGS  Left Ventricle: Left ventricular ejection fraction, by estimation, is 60 to 65%. The left ventricle has normal function. The left ventricle has no regional wall motion abnormalities. The left ventricular internal cavity size was normal in size. There is  no left ventricular hypertrophy. Left ventricular diastolic function could not be evaluated. Right Ventricle: The right ventricular size is normal. No increase in right ventricular wall thickness. Right ventricular systolic function is normal. Left Atrium: Left atrial size  was normal in size. Right Atrium: Right atrial size was normal in size. Pericardium: There is  no evidence of pericardial effusion. Mitral Valve: The mitral valve is normal in structure. Mild mitral annular calcification. No evidence of mitral valve regurgitation. No evidence of mitral valve stenosis. Tricuspid Valve: The tricuspid valve is normal in structure. Tricuspid valve regurgitation is not demonstrated. No evidence of tricuspid stenosis. Aortic Valve: The aortic valve is tricuspid. Aortic valve regurgitation is not visualized. No aortic stenosis is present. Pulmonic Valve: The pulmonic valve was normal in structure. Pulmonic valve regurgitation is not visualized. No evidence of pulmonic stenosis. Aorta: The aortic root is normal in size and structure. Venous: The inferior vena cava was not well visualized. IAS/Shunts: No atrial level shunt detected by color flow Doppler. Skeet Latch MD Electronically signed by Skeet Latch MD Signature Date/Time: 06/03/2020/6:27:37 PM    Final    [place] EKG SITE RITE  Result Date: 06/05/2020 If Site Rite image not attached, placement could not be confirmed due to current cardiac rhythm.

## 2020-06-06 ENCOUNTER — Inpatient Hospital Stay (HOSPITAL_COMMUNITY): Payer: 59 | Admitting: Certified Registered Nurse Anesthetist

## 2020-06-06 ENCOUNTER — Encounter (HOSPITAL_COMMUNITY)
Admission: EM | Disposition: A | Discharge status: Home / Self Care | Payer: Self-pay | Source: Home / Self Care | Attending: Internal Medicine

## 2020-06-06 ENCOUNTER — Inpatient Hospital Stay (HOSPITAL_COMMUNITY): Payer: 59

## 2020-06-06 ENCOUNTER — Encounter (HOSPITAL_COMMUNITY): Payer: Self-pay | Admitting: Internal Medicine

## 2020-06-06 ENCOUNTER — Inpatient Hospital Stay: Payer: Self-pay

## 2020-06-06 DIAGNOSIS — R7881 Bacteremia: Secondary | ICD-10-CM

## 2020-06-06 DIAGNOSIS — I340 Nonrheumatic mitral (valve) insufficiency: Secondary | ICD-10-CM

## 2020-06-06 HISTORY — PX: TEE WITHOUT CARDIOVERSION: SHX5443

## 2020-06-06 LAB — COMPREHENSIVE METABOLIC PANEL
ALT: 43 U/L (ref 0–44)
AST: 68 U/L — ABNORMAL HIGH (ref 15–41)
Albumin: 2.2 g/dL — ABNORMAL LOW (ref 3.5–5.0)
Alkaline Phosphatase: 82 U/L (ref 38–126)
Anion gap: 6 (ref 5–15)
BUN: 15 mg/dL (ref 8–23)
CO2: 32 mmol/L (ref 22–32)
Calcium: 8.1 mg/dL — ABNORMAL LOW (ref 8.9–10.3)
Chloride: 103 mmol/L (ref 98–111)
Creatinine, Ser: 0.7 mg/dL (ref 0.61–1.24)
GFR, Estimated: >60 mL/min (ref >60)
Glucose, Bld: 105 mg/dL — ABNORMAL HIGH (ref 70–99)
Potassium: 3.4 mmol/L — ABNORMAL LOW (ref 3.5–5.1)
Sodium: 141 mmol/L (ref 135–145)
Total Bilirubin: 1 mg/dL (ref 0.3–1.2)
Total Protein: 5.5 g/dL — ABNORMAL LOW (ref 6.5–8.1)

## 2020-06-06 LAB — CBC WITH DIFFERENTIAL/PLATELET
Abs Immature Granulocytes: 0.01 10*3/uL (ref 0.00–0.07)
Basophils Absolute: 0 10*3/uL (ref 0.0–0.1)
Basophils Relative: 0 %
Eosinophils Absolute: 0.2 10*3/uL (ref 0.0–0.5)
Eosinophils Relative: 2 %
HCT: 34 % — ABNORMAL LOW (ref 39.0–52.0)
Hemoglobin: 11.4 g/dL — ABNORMAL LOW (ref 13.0–17.0)
Immature Granulocytes: 0 %
Lymphocytes Relative: 24 %
Lymphs Abs: 1.6 10*3/uL (ref 0.7–4.0)
MCH: 34.4 pg — ABNORMAL HIGH (ref 26.0–34.0)
MCHC: 33.5 g/dL (ref 30.0–36.0)
MCV: 102.7 fL — ABNORMAL HIGH (ref 80.0–100.0)
Monocytes Absolute: 0.5 10*3/uL (ref 0.1–1.0)
Monocytes Relative: 7 %
Neutro Abs: 4.3 10*3/uL (ref 1.7–7.7)
Neutrophils Relative %: 67 %
Platelets: 142 10*3/uL — ABNORMAL LOW (ref 150–400)
RBC: 3.31 MIL/uL — ABNORMAL LOW (ref 4.22–5.81)
RDW: 12.9 % (ref 11.5–15.5)
WBC: 6.5 10*3/uL (ref 4.0–10.5)
nRBC: 0 % (ref 0.0–0.2)

## 2020-06-06 LAB — D-DIMER, QUANTITATIVE: D-Dimer, Quant: 1.29 ug/mL-FEU — ABNORMAL HIGH (ref 0.00–0.50)

## 2020-06-06 LAB — GLUCOSE, CAPILLARY
Glucose-Capillary: 107 mg/dL — ABNORMAL HIGH (ref 70–99)
Glucose-Capillary: 210 mg/dL — ABNORMAL HIGH (ref 70–99)
Glucose-Capillary: 201 mg/dL — ABNORMAL HIGH (ref 70–99)
Glucose-Capillary: 344 mg/dL — ABNORMAL HIGH (ref 70–99)

## 2020-06-06 LAB — C-REACTIVE PROTEIN: CRP: 4.6 mg/dL — ABNORMAL HIGH (ref <1.0)

## 2020-06-06 SURGERY — ECHOCARDIOGRAM, TRANSESOPHAGEAL
Anesthesia: General

## 2020-06-06 MED ORDER — INSULIN GLARGINE 100 UNIT/ML ~~LOC~~ SOLN
5.0000 [IU] | Freq: Once | SUBCUTANEOUS | Status: AC
  Administered 2020-06-06: 5 [IU] via SUBCUTANEOUS
  Filled 2020-06-06: qty 0.05

## 2020-06-06 MED ORDER — POTASSIUM CHLORIDE CRYS ER 20 MEQ PO TBCR
40.0000 meq | EXTENDED_RELEASE_TABLET | Freq: Once | ORAL | Status: AC
  Administered 2020-06-06: 40 meq via ORAL
  Filled 2020-06-06: qty 2

## 2020-06-06 MED ORDER — DEXTROSE 5 % IV SOLN: INTRAVENOUS | Status: AC

## 2020-06-06 MED ORDER — SUCCINYLCHOLINE CHLORIDE 200 MG/10ML IV SOSY
PREFILLED_SYRINGE | INTRAVENOUS | Status: DC | PRN
  Administered 2020-06-06: 140 mg via INTRAVENOUS

## 2020-06-06 MED ORDER — INSULIN ASPART 100 UNIT/ML ~~LOC~~ SOLN
0.0000 [IU] | Freq: Three times a day (TID) | SUBCUTANEOUS | Status: DC
  Administered 2020-06-06 – 2020-06-07 (×3): 5 [IU] via SUBCUTANEOUS

## 2020-06-06 MED ORDER — DEXAMETHASONE SODIUM PHOSPHATE 10 MG/ML IJ SOLN
INTRAMUSCULAR | Status: DC | PRN
  Administered 2020-06-06: 4 mg via INTRAVENOUS

## 2020-06-06 MED ORDER — INSULIN GLARGINE 100 UNIT/ML ~~LOC~~ SOLN
12.0000 [IU] | Freq: Every day | SUBCUTANEOUS | Status: DC
  Administered 2020-06-07: 12 [IU] via SUBCUTANEOUS
  Filled 2020-06-06 (×2): qty 0.12

## 2020-06-06 MED ORDER — INSULIN ASPART 100 UNIT/ML ~~LOC~~ SOLN
0.0000 [IU] | Freq: Every day | SUBCUTANEOUS | Status: DC
  Administered 2020-06-06: 4 [IU] via SUBCUTANEOUS

## 2020-06-06 MED ORDER — LIDOCAINE 2% (20 MG/ML) 5 ML SYRINGE
INTRAMUSCULAR | Status: DC | PRN
  Administered 2020-06-06: 60 mg via INTRAVENOUS

## 2020-06-06 MED ORDER — INSULIN GLARGINE 100 UNIT/ML ~~LOC~~ SOLN: 18.0000 [IU] | Freq: Every day | SUBCUTANEOUS | Status: DC

## 2020-06-06 MED ORDER — LACTATED RINGERS IV SOLN
INTRAVENOUS | Status: DC
  Administered 2020-06-06: 1000 mL via INTRAVENOUS

## 2020-06-06 MED ORDER — PROPOFOL 10 MG/ML IV BOLUS
INTRAVENOUS | Status: DC | PRN
  Administered 2020-06-06: 160 mg via INTRAVENOUS
  Administered 2020-06-06: 50 mg via INTRAVENOUS

## 2020-06-06 MED ORDER — ONDANSETRON HCL 4 MG/2ML IJ SOLN
INTRAMUSCULAR | Status: DC | PRN
  Administered 2020-06-06: 4 mg via INTRAVENOUS

## 2020-06-06 NOTE — CV Procedure (Signed)
TRANSESOPHAGEAL ECHOCARDIOGRAM  Patient anesthetized and intubated per anesthesia Bite guard placed  TEE probe advanced to mid esophagus without difficulty   PRELIMINARY REPORT  TV normal   Trivial TR  MV is mildly thickened.  There is a mass with different soft tissue densities along ventricular side of posterior mitral leaflet, tethered in the chordae.   Measures approximately 8 x 15 mm in maximal dimension.    There is very mild MR  The aortic valve is mildly thickened   There is some shagginess of the aortic valve   QUestion tiny Lambl's excrescence; cannot exclude tiny vegetation. Very mild AI  PV is normal  No PFO by color doppler    LVEF and RVEF are normal  Minimal plaquing of thoracic aorta.  Procedure without complication.   Full report to follow in CV section of chart    Dorris Carnes MD

## 2020-06-06 NOTE — Anesthesia Procedure Notes (Signed)
Procedure Name: Intubation Date/Time: 06/06/2020 1:25 PM Performed by: Colin Benton, CRNA Pre-anesthesia Checklist: Patient identified, Emergency Drugs available, Suction available, Patient being monitored and Timeout performed Patient Re-evaluated:Patient Re-evaluated prior to induction Oxygen Delivery Method: Circle system utilized Preoxygenation: Pre-oxygenation with 100% oxygen Induction Type: IV induction, Rapid sequence and Cricoid Pressure applied Laryngoscope Size: Glidescope and 4 Grade View: Grade I Tube type: Oral Tube size: 7.0 mm Number of attempts: 1 Airway Equipment and Method: Stylet and Video-laryngoscopy Placement Confirmation: ETT inserted through vocal cords under direct vision,  breath sounds checked- equal and bilateral and CO2 detector Secured at: 22 cm Tube secured with: Tape Dental Injury: Teeth and Oropharynx as per pre-operative assessment

## 2020-06-06 NOTE — H&P (View-Only) (Signed)
PROGRESS NOTE                                                                                                                                                                                                             Patient Demographics:    Travis Hampton, is a 60 y.o. male, DOB - [birth year], QJF:[id number]  Outpatient Primary MD for the patient is Reynold Bowen, MD    LOS - 5  Admit date - 06/01/2020    Chief Complaint  Patient presents with  . Shortness of Breath       Brief Narrative (HPI from H&P) - Travis Hampton is a 60 y.o. 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   Subjective:   Patient in bed, appears comfortable, denies any headache, no fever, no chest pain or pressure, no shortness of breath , no abdominal pain. No new focal weakness.   Assessment  & Plan :     1. Acute Hypoxic Resp. Failure due to Acute Covid 19 Viral Pneumonitis during the ongoing 2020 Covid 19 Pandemic - he is partially vaccinated with 2 mRNA shots, he was treated with combination of IV steroids and Remdesivir.  Stable on room air he has finished COVID-19 treatment.   SpO2: 91 % O2 Flow Rate (L/min): 0 L/min FiO2 (%): 21 %  Recent Labs  Lab 06/01/20 1432 06/01/20 1535 06/01/20 1654 06/01/20 1913 06/02/20 0246 06/02/20 1022 06/02/20 1023 06/03/20 0143 06/04/20 0107 06/05/20 0119 06/06/20 0618  WBC  --   --   --   --  7.5  --   --  9.4 13.3* 6.7 6.5  HGB  --   --   --   --  11.0*  --   --  10.9* 12.4* 10.9* 11.4*  HCT  --   --   --   --  32.5*  --   --  32.0* 36.7* 32.2* 34.0*  PLT  --   --   --   --  106*  --   --  132* 196 150 142*  CRP   < >  --   --  18.6* 17.6*  --   --  13.8* 10.6*  6.5* 4.6*  BNP  --   --   --  197.8*  --   4,243.4*  --  172.5* 156.3* 112.6*  --   DDIMER   < >  --   --  2.15* 1.94*  --   --  1.57* 1.82* 1.33* 1.29*  PROCALCITON  --   --   --  0.92  --   --  0.70 0.50 0.35 0.19  --   AST   < >  --   --  39 30  --   --  32 60* 71* 68*  ALT   < >  --   --  26 25  --   --  24 35 41 43  ALKPHOS   < >  --   --  100 90  --   --  93 105 86 82  BILITOT   < >  --   --  2.4* 1.5*  --   --  1.2 1.3* 0.7 1.0  ALBUMIN   < >  --   --  2.6* 2.3*  --   --  2.2* 2.5* 2.1* 2.2*  INR  --   --   --  1.5*  --   --   --   --   --   --   --   LATICACIDVEN  --   --  2.1* 2.9*  --   --   --   --   --   --   --   SARSCOV2NAA  --  POSITIVE*  --   --   --   --   --   --   --   --   --    < > = values in this interval not displayed.      2. MSSA bacteremia.-  D following currently on empiric IV antibiotics ,? Source, does have a few scabs on his R.leg.  Repeat blood cultures drawn on 06/03/2020 - thus far transthoracic echocardiogram stable, undergoing TEE on 06/06/2020. Per ID if TEE is stable then oral antibiotics.  3.  Dyslipidemia.  On statin continue.  4.  Elevated lactic acid, hydrate, hold Glucophage.    5.  Hypokalemia.  Replaced.    6. DM 2 -poor outpatient control due to hyperglycemia: Placed on Lantus along with sliding scale, DM and insulin education provided, dose adjusted as he is n.p.o. on 4 03/27/2008.  Discontinue Glucophage upon discharge due to lactic acidosis.  Lab Results  Component Value Date   HGBA1C 10.9 (H) 06/02/2020   CBG (last 3)  Recent Labs    06/05/20 1729 06/05/20 2031 06/06/20 0721  GLUCAP 241* 235* 107*         Condition - Fair  Family Communication  : Sherryll Burger [phone] 06/02/20  Code Status :  Full  Consults  :  None  PUD Prophylaxis : None   Procedures  :     TTE -  1. Left ventricular ejection fraction, by estimation, is 60 to 65%. The left ventricle has normal function. The left ventricle has no regional wall motion abnormalities. Left ventricular diastolic  function could not be evaluated.  2. Right ventricular systolic function is normal. The right ventricular size is normal.  3. The mitral valve is normal in structure. No evidence of mitral valve regurgitation. No evidence of mitral stenosis.  4. The aortic valve is tricuspid. Aortic valve regurgitation is not visualized. No aortic stenosis is present.   TEE  Disposition Plan  :    Status is: Inpatient  Remains inpatient appropriate because:IV treatments appropriate due to intensity of illness or inability to take PO   Dispo: The patient is from: Home              Anticipated d/c is to: Home              Patient currently is not medically stable to d/c.   Difficult to place patient No  DVT Prophylaxis  :  Lovenox    Lab Results  Component Value Date   PLT 142 (L) 06/06/2020    Diet :  Diet Order            Diet NPO time specified Except for: Sips with Meds  Diet effective midnight                  Inpatient Medications  Scheduled Meds: . atorvastatin  20 mg Oral Daily  . enoxaparin (LOVENOX) injection  40 mg Subcutaneous Q24H  . insulin aspart  0-15 Units Subcutaneous TID WC  . insulin aspart  0-5 Units Subcutaneous QHS  . [START ON 06/07/2020] insulin glargine  12 Units Subcutaneous Daily  . insulin glargine  5 Units Subcutaneous Once  . methylPREDNISolone (SOLU-MEDROL) injection  20 mg Intravenous Q24H   Continuous Infusions: .  ceFAZolin (ANCEF) IV 2 g (06/06/20 0626)  . dextrose     PRN Meds:.acetaminophen, guaiFENesin-dextromethorphan, Ipratropium-Albuterol, senna-docusate  Antibiotics  :    Anti-infectives (From admission, onward)   Start     Dose/Rate Route Frequency Ordered Stop   06/02/20 1745  ceFAZolin (ANCEF) IVPB 2g/100 mL premix        2 g 200 mL/hr over 30 Minutes Intravenous Every 8 hours 06/02/20 1710     06/02/20 1145  levofloxacin (LEVAQUIN) tablet 500 mg  Status:  Discontinued        500 mg Oral Daily 06/02/20 1048 06/02/20 1713    06/02/20 1000  remdesivir 100 mg in sodium chloride 0.9 % 100 mL IVPB       "Followed by" Linked Group Details   100 mg 200 mL/hr over 30 Minutes Intravenous Daily 06/01/20 1816 06/05/20 0940   06/02/20 0645  ceFAZolin (ANCEF) IVPB 2g/100 mL premix  Status:  Discontinued        2 g 200 mL/hr over 30 Minutes Intravenous Every 8 hours 06/02/20 0613 06/02/20 1044   06/01/20 1815  remdesivir 200 mg in sodium chloride 0.9% 250 mL IVPB       "Followed by" Linked Group Details   200 mg 580 mL/hr over 30 Minutes Intravenous Once 06/01/20 1816 06/01/20 2013   06/01/20 1515  cefTRIAXone (ROCEPHIN) 1 g in sodium chloride 0.9 % 100 mL IVPB        1 g 200 mL/hr over 30 Minutes Intravenous  Once 06/01/20 1508 06/01/20 1642   06/01/20 1515  azithromycin (ZITHROMAX) 500 mg in sodium chloride 0.9 % 250 mL IVPB        500 mg 250 mL/hr over 60 Minutes Intravenous  Once 06/01/20 1508 06/01/20 1757       Time Spent in minutes  30   Lala Lund M.D on 06/06/2020 at 9:13 AM  To page go to [email or web address]   [facility] -  Office  [phone]    See all Orders from today for further details    Objective:   Vitals:   06/05/20 1207 06/05/20 1608 06/05/20 2032 06/06/20 0429  BP: (!) 108/52  Marland Kitchen)  116/58 112/60  Pulse: 68  73 69  Resp: 16  18 18   Temp: 98.7 F (37.1 C)  98.4 F (36.9 C) 97.8 F (36.6 C)  TempSrc: Oral  Oral Oral  SpO2: 96% 96% 97% 91%  Weight:      Height:        Wt Readings from Last 3 Encounters:  06/02/20 118.6 kg  05/29/20 115.7 kg  10/14/15 134.3 kg     Intake/Output Summary (Last 24 hours) at 06/06/2020 0913 Last data filed at 06/06/2020 0806 Gross per 24 hour  Intake 440 ml  Output 1900 ml  Net -1460 ml     Physical Exam  Awake Alert, No new F.N deficits, Normal affect [place].AT,PERRAL Supple Neck,No JVD, No cervical lymphadenopathy appriciated.  Symmetrical Chest wall movement, Good air movement bilaterally, CTAB RRR,No Gallops, Rubs or new Murmurs, No  Parasternal Heave +ve B.Sounds, Abd Soft, No tenderness, No organomegaly appriciated, No rebound - guarding or rigidity. No Cyanosis, Clubbing or edema, No new Rash or bruise     Data Review:    CBC Recent Labs  Lab 06/02/20 0246 06/03/20 0143 06/04/20 0107 06/05/20 0119 06/06/20 0618  WBC 7.5 9.4 13.3* 6.7 6.5  HGB 11.0* 10.9* 12.4* 10.9* 11.4*  HCT 32.5* 32.0* 36.7* 32.2* 34.0*  PLT 106* 132* 196 150 142*  MCV 100.3* 100.9* 101.7* 102.2* 102.7*  MCH 34.0 34.4* 34.3* 34.6* 34.4*  MCHC 33.8 34.1 33.8 33.9 33.5  RDW 12.7 12.7 12.7 12.8 12.9  LYMPHSABS 0.4* 0.6* 1.7 1.1 1.6  MONOABS 0.2 0.3 1.0 0.6 0.5  EOSABS 0.0 0.0 0.0 0.0 0.2  BASOSABS 0.0 0.0 0.0 0.0 0.0    Recent Labs  Lab 06/01/20 1432 06/01/20 1654 06/01/20 1913 06/02/20 0246 06/02/20 1022 06/02/20 1023 06/03/20 0143 06/04/20 0107 06/05/20 0119 06/06/20 0618  NA  --   --   --  131*  --   --  132* 135 137 141  K  --   --   --  4.1  --   --  4.4 3.7 3.4* 3.4*  CL  --   --   --  98  --   --  98 98 102 103  CO2  --   --   --  27  --   --  25 26 31  32  GLUCOSE  --   --   --  460*  --   --  312* 177* 141* 105*  BUN  --   --   --  15  --   --  25* 25* 20 15  CREATININE  --   --   --  0.93  --   --  0.79 0.82 0.83 0.70  CALCIUM  --   --   --  7.8*  --   --  8.0* 8.7* 8.1* 8.1*  AST   < >  --  39 30  --   --  32 60* 71* 68*  ALT   < >  --  26 25  --   --  24 35 41 43  ALKPHOS   < >  --  100 90  --   --  93 105 86 82  BILITOT   < >  --  2.4* 1.5*  --   --  1.2 1.3* 0.7 1.0  ALBUMIN   < >  --  2.6* 2.3*  --   --  2.2* 2.5* 2.1* 2.2*  MG  --   --   --  1.8  --   --  1.9 2.0 1.9  --   CRP   < >  --  18.6* 17.6*  --   --  13.8* 10.6* 6.5* 4.6*  DDIMER   < >  --  2.15* 1.94*  --   --  1.57* 1.82* 1.33* 1.29*  PROCALCITON  --   --  0.92  --   --  0.70 0.50 0.35 0.19  --   LATICACIDVEN  --  2.1* 2.9*  --   --   --   --   --   --   --   INR  --   --  1.5*  --   --   --   --   --   --   --   HGBA1C  --   --   --   10.9*  --   --   --   --   --   --   BNP  --   --  197.8*  --  4,243.4*  --  172.5* 156.3* 112.6*  --    < > = values in this interval not displayed.    ------------------------------------------------------------------------------------------------------------------ No results for input(s): CHOL, HDL, LDLCALC, TRIG, CHOLHDL, LDLDIRECT in the last 72 hours.  Lab Results  Component Value Date   HGBA1C 10.9 (H) 06/02/2020   ------------------------------------------------------------------------------------------------------------------ No results for input(s): TSH, T4TOTAL, T3FREE, THYROIDAB in the last 72 hours.  Invalid input(s): FREET3  Cardiac Enzymes No results for input(s): CKMB, TROPONINI, MYOGLOBIN in the last 168 hours.  Invalid input(s): CK ------------------------------------------------------------------------------------------------------------------    Component Value Date/Time   BNP 112.6 (H) 06/05/2020 0119    Micro Results Recent Results (from the past 240 hour(s))  Covid-19, Flu A+B ([facility])     Status: None   Collection Time: 06/01/20 12:54 PM   Specimen: Nasopharyngeal   Naso  Result Value Ref Range Status   SARS-CoV-2, NAA Not Detected Not Detected Final    Comment: This nucleic acid amplification test was developed and its performance characteristics determined by [employer]. Nucleic acid amplification tests include RT-PCR and TMA. This test has not been FDA cleared or approved. This test has been authorized by FDA under an Emergency Use Authorization (EUA). This test is only authorized for the duration of time the declaration that circumstances exist justifying the authorization of the emergency use of in vitro diagnostic tests for detection of SARS-CoV-2 virus and/or diagnosis of COVID-19 infection under section 564(b)(1) of the Act, 21 U.S.C. 638VFI-4(P) (1), unless the authorization is terminated or revoked sooner. When diagnostic  testing is negative, the possibility of a false negative result should be considered in the context of a patient's recent exposures and the presence of clinical signs and symptoms consistent with COVID-19. An individual without symptoms of COVID-19 and who is not shedding SARS-CoV-2 virus wo uld expect to have a negative (not detected) result in this assay.    Influenza A, NAA Not Detected Not Detected Final   Influenza B, NAA Not Detected Not Detected Final  Resp Panel by RT-PCR (Flu A&B, Covid) Nasopharyngeal Swab     Status: Abnormal   Collection Time: 06/01/20  3:35 PM   Specimen: Nasopharyngeal Swab; Nasopharyngeal(NP) swabs in vial transport medium  Result Value Ref Range Status   SARS Coronavirus 2 by RT PCR POSITIVE (A) NEGATIVE Final    Comment: RESULT CALLED TO, READ BACK BY AND VERIFIED WITH: (NOTE) SARS-CoV-2 target nucleic acids are DETECTED.  The SARS-CoV-2 RNA is  generally detectable in upper respiratory specimens during the acute phase of infection. Positive results are indicative of the presence of the identified virus, but do not rule out bacterial infection or co-infection with other pathogens not detected by the test. Clinical correlation with patient history and other diagnostic information is necessary to determine patient infection status. The expected result is Negative.  Fact Sheet for Patients: EntrepreneurPulse.com.au  Fact Sheet for Healthcare Providers: IncredibleEmployment.be  This test is not yet approved or cleared by the [place] FDA and  has been authorized for detection and/or diagnosis of SARS-CoV-2 by FDA under an Emergency Use Authorization (EUA).  This EUA will remain in effect (meaning this test can be used) for the duration of  the [place] VID-19 declaration under Section 564(b)(1) of the Act, 21 U.S.C. section 360bbb-3(b)(1), unless the authorization is terminated or revoked sooner.     Influenza A  by PCR NEGATIVE NEGATIVE Final    Comment: A,OLEARY @1735  06/01/20 EB   Influenza B by PCR NEGATIVE NEGATIVE Final    Comment: (NOTE) The Xpert Xpress SARS-CoV-2/FLU/RSV plus assay is intended as an aid in the diagnosis of influenza from Nasopharyngeal swab specimens and should not be used as a sole basis for treatment. Nasal washings and aspirates are unacceptable for Xpert Xpress SARS-CoV-2/FLU/RSV testing.  Fact Sheet for Patients: EntrepreneurPulse.com.au  Fact Sheet for Healthcare Providers: IncredibleEmployment.be  This test is not yet approved or cleared by the [place] FDA and has been authorized for detection and/or diagnosis of SARS-CoV-2 by FDA under an Emergency Use Authorization (EUA). This EUA will remain in effect (meaning this test can be used) for the duration of the COVID-19 declaration under Section 564(b)(1) of the Act, 21 U.S.C. section 360bbb-3(b)(1), unless the authorization is terminated or revoked.  Performed at [facility], [place] [address], [place], [place] [address]   Blood culture (routine x 2)     Status: Abnormal   Collection Time: 06/01/20  4:00 PM   Specimen: BLOOD RIGHT FOREARM  Result Value Ref Range Status   Specimen Description BLOOD RIGHT FOREARM  Final   Special Requests   Final    BOTTLES DRAWN AEROBIC AND ANAEROBIC Blood Culture adequate volume   Culture  Setup Time   Final    GRAM POSITIVE COCCI IN CLUSTERS IN BOTH AEROBIC AND ANAEROBIC BOTTLES Organism ID to follow CRITICAL RESULT CALLED TO, READ BACK BY AND VERIFIED WITHSalli Real, AT 0603 06/02/20 Rush Landmark Performed at [facility], [place] [address], [place], [place] [address]    Culture STAPHYLOCOCCUS AUREUS (A)  Final   Report Status 06/04/2020 FINAL  Final   Organism ID, Bacteria STAPHYLOCOCCUS AUREUS  Final      Susceptibility   Staphylococcus aureus - MIC*    CIPROFLOXACIN <=0.5 SENSITIVE Sensitive     ERYTHROMYCIN  <=0.25 SENSITIVE Sensitive     GENTAMICIN <=0.5 SENSITIVE Sensitive     OXACILLIN <=0.25 SENSITIVE Sensitive     TETRACYCLINE <=1 SENSITIVE Sensitive     VANCOMYCIN 1 SENSITIVE Sensitive     TRIMETH/SULFA <=10 SENSITIVE Sensitive     CLINDAMYCIN <=0.25 SENSITIVE Sensitive     RIFAMPIN <=0.5 SENSITIVE Sensitive     Inducible Clindamycin NEGATIVE Sensitive     * STAPHYLOCOCCUS AUREUS  Blood Culture ID Panel (Reflexed)     Status: Abnormal   Collection Time: 06/01/20  4:00 PM  Result Value Ref Range Status   Enterococcus faecalis NOT DETECTED NOT DETECTED Final   Enterococcus  Faecium NOT DETECTED NOT DETECTED Final   Listeria monocytogenes NOT DETECTED NOT DETECTED Final   Staphylococcus species DETECTED (A) NOT DETECTED Final    Comment: CRITICAL RESULT CALLED TO, READ BACK BY AND VERIFIED WITH: G. ABBOTT PHARMD, AT 0603 06/02/20 D. VANHOOK    Staphylococcus aureus (BCID) DETECTED (A) NOT DETECTED Final    Comment: CRITICAL RESULT CALLED TO, READ BACK BY AND VERIFIED WITH: G. ABBOTT PHARMD, AT 0603 06/02/20 D. VANHOOK    Staphylococcus epidermidis NOT DETECTED NOT DETECTED Final   Staphylococcus lugdunensis NOT DETECTED NOT DETECTED Final   Streptococcus species NOT DETECTED NOT DETECTED Final   Streptococcus agalactiae NOT DETECTED NOT DETECTED Final   Streptococcus pneumoniae NOT DETECTED NOT DETECTED Final   Streptococcus pyogenes NOT DETECTED NOT DETECTED Final   A.calcoaceticus-baumannii NOT DETECTED NOT DETECTED Final   Bacteroides fragilis NOT DETECTED NOT DETECTED Final   Enterobacterales NOT DETECTED NOT DETECTED Final   Enterobacter cloacae complex NOT DETECTED NOT DETECTED Final   Escherichia coli NOT DETECTED NOT DETECTED Final   Klebsiella aerogenes NOT DETECTED NOT DETECTED Final   Klebsiella oxytoca NOT DETECTED NOT DETECTED Final   Klebsiella pneumoniae NOT DETECTED NOT DETECTED Final   Proteus species NOT DETECTED NOT DETECTED Final   Salmonella species NOT  DETECTED NOT DETECTED Final   Serratia marcescens NOT DETECTED NOT DETECTED Final   Haemophilus influenzae NOT DETECTED NOT DETECTED Final   Neisseria meningitidis NOT DETECTED NOT DETECTED Final   Pseudomonas aeruginosa NOT DETECTED NOT DETECTED Final   Stenotrophomonas maltophilia NOT DETECTED NOT DETECTED Final   Candida albicans NOT DETECTED NOT DETECTED Final   Candida auris NOT DETECTED NOT DETECTED Final   Candida glabrata NOT DETECTED NOT DETECTED Final   Candida krusei NOT DETECTED NOT DETECTED Final   Candida parapsilosis NOT DETECTED NOT DETECTED Final   Candida tropicalis NOT DETECTED NOT DETECTED Final   Cryptococcus neoformans/gattii NOT DETECTED NOT DETECTED Final   Meth resistant mecA/C and MREJ NOT DETECTED NOT DETECTED Final    Comment: Performed at [facility], [address] [place], [place] [address]  Blood culture (routine x 2)     Status: Abnormal   Collection Time: 06/01/20  4:02 PM   Specimen: BLOOD LEFT FOREARM  Result Value Ref Range Status   Specimen Description BLOOD LEFT FOREARM  Final   Special Requests   Final    BOTTLES DRAWN AEROBIC AND ANAEROBIC Blood Culture adequate volume   Culture  Setup Time   Final    GRAM POSITIVE COCCI IN CLUSTERS IN BOTH AEROBIC AND ANAEROBIC BOTTLES    Culture (A)  Final    STAPHYLOCOCCUS AUREUS SUSCEPTIBILITIES PERFORMED ON PREVIOUS CULTURE WITHIN THE LAST 5 DAYS. Performed at [facility], [place] [address], [place], [place] [address]    Report Status 06/04/2020 FINAL  Final  Culture, blood (routine x 2)     Status: None (Preliminary result)   Collection Time: 06/03/20  9:13 AM   Specimen: BLOOD  Result Value Ref Range Status   Specimen Description BLOOD LEFT ANTECUBITAL  Final   Special Requests   Final    BOTTLES DRAWN AEROBIC AND ANAEROBIC Blood Culture adequate volume   Culture   Final    NO GROWTH 2 DAYS Performed at [facility], [place] [address], [place], [place] [address]    Report Status  PENDING  Incomplete  Culture, blood (routine x 2)     Status: None (Preliminary result)   Collection  Time: 06/03/20  9:14 AM   Specimen: BLOOD  Result Value Ref Range Status   Specimen Description BLOOD BLOOD LEFT HAND  Final   Special Requests   Final    BOTTLES DRAWN AEROBIC AND ANAEROBIC Blood Culture adequate volume   Culture   Final    NO GROWTH 2 DAYS Performed at [facility], [address] [place], [place] [address]    Report Status PENDING  Incomplete    Radiology Reports DG Chest 2 View  Result Date: 06/01/2020 CLINICAL DATA:  Shortness of breath EXAM: CHEST - 2 VIEW COMPARISON:  06/01/2020 prior studies FINDINGS: The cardiomediastinal silhouette is unremarkable. Minimal pulmonary vascular congestion present. There is no evidence of focal airspace disease, pulmonary edema, suspicious pulmonary nodule/mass, pleural effusion, or pneumothorax. No acute bony abnormalities are identified. IMPRESSION: Minimal pulmonary vascular congestion. Electronically Signed   By: Margarette Canada M.D.   On: 06/01/2020 14:59   DG Chest 2 View  Result Date: 06/01/2020 CLINICAL DATA:  Cough and shortness of breath EXAM: CHEST - 2 VIEW COMPARISON:  05/29/20 FINDINGS: The heart size and mediastinal contours are within normal limits. Both lungs are clear. Increased pulmonary vascular congestion without overt edema. No pleural effusion. The visualized skeletal structures are unremarkable. IMPRESSION: Increased pulmonary vascular congestion without overt edema. Electronically Signed   By: Kerby Moors M.D.   On: 06/01/2020 13:12   DG Chest 2 View  Result Date: 05/29/2020 CLINICAL DATA:  Chest pain and shortness of breath beginning yesterday. Intermittent hemoptysis. EXAM: CHEST - 2 VIEW COMPARISON:  01/04/2015 FINDINGS: The heart size and mediastinal contours are within normal limits. Both lungs are clear. The visualized skeletal structures are unremarkable. IMPRESSION: No active cardiopulmonary  disease. Electronically Signed   By: Marlaine Hind M.D.   On: 05/29/2020 13:48   CT Angio Chest PE W/Cm &/Or Wo Cm  Result Date: 05/29/2020 CLINICAL DATA:  Chest pain. EXAM: CT ANGIOGRAPHY CHEST WITH CONTRAST TECHNIQUE: Multidetector CT imaging of the chest was performed using the standard protocol during bolus administration of intravenous contrast. Multiplanar CT image reconstructions and MIPs were obtained to evaluate the vascular anatomy. CONTRAST:  144mL OMNIPAQUE IOHEXOL 350 MG/ML SOLN COMPARISON:  January 04, 2015. FINDINGS: Cardiovascular: Satisfactory opacification of the pulmonary arteries to the segmental level. No evidence of pulmonary embolism. Normal heart size. No pericardial effusion. Mediastinum/Nodes: No enlarged mediastinal, hilar, or axillary lymph nodes. Thyroid gland, trachea, and esophagus demonstrate no significant findings. Lungs/Pleura: No pneumothorax or pleural effusion is noted. 5 mm nodule is noted in left lower lobe best seen on image number 53 of series 5. 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 Mild splenomegaly is noted. Musculoskeletal: No chest wall abnormality. No acute or significant osseous findings. Review of the MIP images confirms the above findings. IMPRESSION: 1. No definite evidence of pulmonary embolus. 2. 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 Mild splenomegaly is noted. 3. 5 mm left lower lobe nodule is noted. No follow-up needed if patient is low-risk. Non-contrast chest CT can be considered in 12 months if patient is high-risk. This recommendation follows the consensus statement: Guidelines for Management of Incidental Pulmonary Nodules Detected on CT Images: From the Fleischner Society 2017; Radiology 2017; 284:228-243. Electronically Signed   By: Marijo Conception M.D.   On: 05/29/2020 15:52   US Venous Img Lower Unilateral Left  Result Date: 05/29/2020 CLINICAL DATA:  Left leg pain EXAM: LEFT LOWER EXTREMITY VENOUS DOPPLER ULTRASOUND TECHNIQUE: Gray-scale  sonography with graded compression, as well  as color Doppler and duplex ultrasound were performed to evaluate the lower extremity deep venous systems from the level of the common femoral vein and including the common femoral, femoral, profunda femoral, popliteal and calf veins including the posterior tibial, peroneal and gastrocnemius veins when visible. The superficial great saphenous vein was also interrogated. Spectral Doppler was utilized to evaluate flow at rest and with distal augmentation maneuvers in the common femoral, femoral and popliteal veins. COMPARISON:  None. FINDINGS: Contralateral Common Femoral Vein: Respiratory phasicity is normal and symmetric with the symptomatic side. No evidence of thrombus. Normal compressibility. Common Femoral Vein: No evidence of thrombus. Normal compressibility, respiratory phasicity and response to augmentation. Saphenofemoral Junction: No evidence of thrombus. Normal compressibility and flow on color Doppler imaging. Profunda Femoral Vein: No evidence of thrombus. Normal compressibility and flow on color Doppler imaging. Femoral Vein: No evidence of thrombus. Normal compressibility, respiratory phasicity and response to augmentation. Popliteal Vein: No evidence of thrombus. Normal compressibility, respiratory phasicity and response to augmentation. Calf Veins: No evidence of thrombus. Normal compressibility and flow on color Doppler imaging. Superficial Great Saphenous Vein: No evidence of thrombus. Normal compressibility. Venous Reflux:  None. Other Findings:  None. IMPRESSION: No evidence of deep venous thrombosis. Electronically Signed   By: Inez Catalina M.D.   On: 05/29/2020 15:57   DG Chest Port 1 View  Result Date: 06/02/2020 CLINICAL DATA:  60 year old male with shortness of breath, history of COVID 19. EXAM: PORTABLE CHEST - 1 VIEW COMPARISON:  06/01/2020 FINDINGS: The mediastinal contours are within normal limits. No cardiomegaly. The lungs are clear  bilaterally without evidence of focal consolidation, pleural effusion, or pneumothorax. No acute osseous abnormality. IMPRESSION: No acute cardiopulmonary process. Electronically Signed   By: Ruthann Cancer MD   On: 06/02/2020 08:47   ECHOCARDIOGRAM COMPLETE  Result Date: 06/03/2020    ECHOCARDIOGRAM REPORT   Patient Name:   TANNEN VANDEZANDE Date of Exam: 06/03/2020 Medical Rec #:  [id number]        Height:       74.0 in Accession #:    [id number]       Weight:       261.4 lb Date of Birth:  1960-10-05        BSA:          2.435 m Patient Age:    49 years         BP:           118/62 mmHg Patient Gender: M                HR:           99 bpm. Exam Location:  Inpatient Procedure: 2D Echo Indications:    Bacteremia R78.81  History:        Patient has no prior history of Echocardiogram examinations.                 Risk Factors:Diabetes, Dyslipidemia and Former Smoker. COVID 19,                 Staph aureus bactermia.  Sonographer:    Leavy Cella Referring Phys: [place]  1. Left ventricular ejection fraction, by estimation, is 60 to 65%. The left ventricle has normal function. The left ventricle has no regional wall motion abnormalities. Left ventricular diastolic function could not be evaluated.  2. Right ventricular systolic function is normal. The right ventricular size is normal.  3. The mitral valve is normal  in structure. No evidence of mitral valve regurgitation. No evidence of mitral stenosis.  4. The aortic valve is tricuspid. Aortic valve regurgitation is not visualized. No aortic stenosis is present. FINDINGS  Left Ventricle: Left ventricular ejection fraction, by estimation, is 60 to 65%. The left ventricle has normal function. The left ventricle has no regional wall motion abnormalities. The left ventricular internal cavity size was normal in size. There is  no left ventricular hypertrophy. Left ventricular diastolic function could not be evaluated. Right Ventricle: The right  ventricular size is normal. No increase in right ventricular wall thickness. Right ventricular systolic function is normal. Left Atrium: Left atrial size was normal in size. Right Atrium: Right atrial size was normal in size. Pericardium: There is no evidence of pericardial effusion. Mitral Valve: The mitral valve is normal in structure. Mild mitral annular calcification. No evidence of mitral valve regurgitation. No evidence of mitral valve stenosis. Tricuspid Valve: The tricuspid valve is normal in structure. Tricuspid valve regurgitation is not demonstrated. No evidence of tricuspid stenosis. Aortic Valve: The aortic valve is tricuspid. Aortic valve regurgitation is not visualized. No aortic stenosis is present. Pulmonic Valve: The pulmonic valve was normal in structure. Pulmonic valve regurgitation is not visualized. No evidence of pulmonic stenosis. Aorta: The aortic root is normal in size and structure. Venous: The inferior vena cava was not well visualized. IAS/Shunts: No atrial level shunt detected by color flow Doppler. Skeet Latch MD Electronically signed by Skeet Latch MD Signature Date/Time: 06/03/2020/6:27:37 PM    Final    [place] EKG SITE RITE  Result Date: 06/05/2020 If Site Rite image not attached, placement could not be confirmed due to current cardiac rhythm.

## 2020-06-06 NOTE — Interval H&P Note (Signed)
History and Physical Interval Note:  06/06/2020 12:54 PM  Travis Hampton  has presented today for surgery, with the diagnosis of BACTEREMIA.  The various methods of treatment have been discussed with the patient and family. After consideration of risks, benefits and other options for treatment, the patient has consented to  Procedure(s): TRANSESOPHAGEAL ECHOCARDIOGRAM (TEE) (N/A) as a surgical intervention.  The patient's history has been reviewed, patient examined, no change in status, stable for surgery.  I have reviewed the patient's chart and labs.  Questions were answered to the patient's satisfaction.     Dorris Carnes

## 2020-06-06 NOTE — Progress Notes (Signed)
PROGRESS NOTE                                                                                                                                                                                                             Patient Demographics:    Travis Hampton, is a 60 y.o. male, DOB - Feb 25, 1961, ZTI:[id number]  Outpatient Primary MD for the patient is Reynold Bowen, MD    LOS - 5  Admit date - 06/01/2020    Chief Complaint  Patient presents with  . Shortness of Breath       Brief Narrative (HPI from H&P) - LENWOOD BALSAM is a 60 y.o. 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   Subjective:   Patient in bed, appears comfortable, denies any headache, no fever, no chest pain or pressure, no shortness of breath , no abdominal pain. No new focal weakness.   Assessment  & Plan :     1. Acute Hypoxic Resp. Failure due to Acute Covid 19 Viral Pneumonitis during the ongoing 2020 Covid 19 Pandemic - he is partially vaccinated with 2 mRNA shots, he was treated with combination of IV steroids and Remdesivir.  Stable on room air he has finished COVID-19 treatment.   SpO2: 91 % O2 Flow Rate (L/min): 0 L/min FiO2 (%): 21 %  Recent Labs  Lab 06/01/20 1432 06/01/20 1535 06/01/20 1654 06/01/20 1913 06/02/20 0246 06/02/20 1022 06/02/20 1023 06/03/20 0143 06/04/20 0107 06/05/20 0119 06/06/20 0618  WBC  --   --   --   --  7.5  --   --  9.4 13.3* 6.7 6.5  HGB  --   --   --   --  11.0*  --   --  10.9* 12.4* 10.9* 11.4*  HCT  --   --   --   --  32.5*  --   --  32.0* 36.7* 32.2* 34.0*  PLT  --   --   --   --  106*  --   --  132* 196 150 142*  CRP   < >  --   --  18.6* 17.6*  --   --  13.8* 10.6*  6.5* 4.6*  BNP  --   --   --  197.8*  --   4,243.4*  --  172.5* 156.3* 112.6*  --   DDIMER   < >  --   --  2.15* 1.94*  --   --  1.57* 1.82* 1.33* 1.29*  PROCALCITON  --   --   --  0.92  --   --  0.70 0.50 0.35 0.19  --   AST   < >  --   --  39 30  --   --  32 60* 71* 68*  ALT   < >  --   --  26 25  --   --  24 35 41 43  ALKPHOS   < >  --   --  100 90  --   --  93 105 86 82  BILITOT   < >  --   --  2.4* 1.5*  --   --  1.2 1.3* 0.7 1.0  ALBUMIN   < >  --   --  2.6* 2.3*  --   --  2.2* 2.5* 2.1* 2.2*  INR  --   --   --  1.5*  --   --   --   --   --   --   --   LATICACIDVEN  --   --  2.1* 2.9*  --   --   --   --   --   --   --   SARSCOV2NAA  --  POSITIVE*  --   --   --   --   --   --   --   --   --    < > = values in this interval not displayed.      2. MSSA bacteremia.-  D following currently on empiric IV antibiotics ,? Source, does have a few scabs on his R.leg.  Repeat blood cultures drawn on 06/03/2020 - thus far transthoracic echocardiogram stable, undergoing TEE on 06/06/2020. Per ID if TEE is stable then oral antibiotics.  3.  Dyslipidemia.  On statin continue.  4.  Elevated lactic acid, hydrate, hold Glucophage.    5.  Hypokalemia.  Replaced.    6. DM 2 -poor outpatient control due to hyperglycemia: Placed on Lantus along with sliding scale, DM and insulin education provided, dose adjusted as he is n.p.o. on 4 03/27/2008.  Discontinue Glucophage upon discharge due to lactic acidosis.  Lab Results  Component Value Date   HGBA1C 10.9 (H) 06/02/2020   CBG (last 3)  Recent Labs    06/05/20 1729 06/05/20 2031 06/06/20 0721  GLUCAP 241* 235* 107*         Condition - Fair  Family Communication  : Sherryll Burger [phone] 06/02/20  Code Status :  Full  Consults  :  None  PUD Prophylaxis : None   Procedures  :     TTE -  1. Left ventricular ejection fraction, by estimation, is 60 to 65%. The left ventricle has normal function. The left ventricle has no regional wall motion abnormalities. Left ventricular diastolic  function could not be evaluated.  2. Right ventricular systolic function is normal. The right ventricular size is normal.  3. The mitral valve is normal in structure. No evidence of mitral valve regurgitation. No evidence of mitral stenosis.  4. The aortic valve is tricuspid. Aortic valve regurgitation is not visualized. No aortic stenosis is present.   TEE  Disposition Plan  :    Status is: Inpatient  Remains inpatient appropriate because:IV treatments appropriate due to intensity of illness or inability to take PO   Dispo: The patient is from: Home              Anticipated d/c is to: Home              Patient currently is not medically stable to d/c.   Difficult to place patient No  DVT Prophylaxis  :  Lovenox    Lab Results  Component Value Date   PLT 142 (L) 06/06/2020    Diet :  Diet Order            Diet NPO time specified Except for: Sips with Meds  Diet effective midnight                  Inpatient Medications  Scheduled Meds: . atorvastatin  20 mg Oral Daily  . enoxaparin (LOVENOX) injection  40 mg Subcutaneous Q24H  . insulin aspart  0-15 Units Subcutaneous TID WC  . insulin aspart  0-5 Units Subcutaneous QHS  . [START ON 06/07/2020] insulin glargine  12 Units Subcutaneous Daily  . insulin glargine  5 Units Subcutaneous Once  . methylPREDNISolone (SOLU-MEDROL) injection  20 mg Intravenous Q24H   Continuous Infusions: .  ceFAZolin (ANCEF) IV 2 g (06/06/20 0626)  . dextrose     PRN Meds:.acetaminophen, guaiFENesin-dextromethorphan, Ipratropium-Albuterol, senna-docusate  Antibiotics  :    Anti-infectives (From admission, onward)   Start     Dose/Rate Route Frequency Ordered Stop   06/02/20 1745  ceFAZolin (ANCEF) IVPB 2g/100 mL premix        2 g 200 mL/hr over 30 Minutes Intravenous Every 8 hours 06/02/20 1710     06/02/20 1145  levofloxacin (LEVAQUIN) tablet 500 mg  Status:  Discontinued        500 mg Oral Daily 06/02/20 1048 06/02/20 1713    06/02/20 1000  remdesivir 100 mg in sodium chloride 0.9 % 100 mL IVPB       "Followed by" Linked Group Details   100 mg 200 mL/hr over 30 Minutes Intravenous Daily 06/01/20 1816 06/05/20 0940   06/02/20 0645  ceFAZolin (ANCEF) IVPB 2g/100 mL premix  Status:  Discontinued        2 g 200 mL/hr over 30 Minutes Intravenous Every 8 hours 06/02/20 0613 06/02/20 1044   06/01/20 1815  remdesivir 200 mg in sodium chloride 0.9% 250 mL IVPB       "Followed by" Linked Group Details   200 mg 580 mL/hr over 30 Minutes Intravenous Once 06/01/20 1816 06/01/20 2013   06/01/20 1515  cefTRIAXone (ROCEPHIN) 1 g in sodium chloride 0.9 % 100 mL IVPB        1 g 200 mL/hr over 30 Minutes Intravenous  Once 06/01/20 1508 06/01/20 1642   06/01/20 1515  azithromycin (ZITHROMAX) 500 mg in sodium chloride 0.9 % 250 mL IVPB        500 mg 250 mL/hr over 60 Minutes Intravenous  Once 06/01/20 1508 06/01/20 1757       Time Spent in minutes  30   Lala Lund M.D on 06/06/2020 at 9:13 AM  To page go to [email or web address]   [facility] -  Office  [phone]    See all Orders from today for further details    Objective:   Vitals:   06/05/20 1207 06/05/20 1608 06/05/20 2032 06/06/20 0429  BP: (!) 108/52  Marland Kitchen)  116/58 112/60  Pulse: 68  73 69  Resp: 16  18 18   Temp: 98.7 F (37.1 C)  98.4 F (36.9 C) 97.8 F (36.6 C)  TempSrc: Oral  Oral Oral  SpO2: 96% 96% 97% 91%  Weight:      Height:        Wt Readings from Last 3 Encounters:  06/02/20 118.6 kg  05/29/20 115.7 kg  10/14/15 134.3 kg     Intake/Output Summary (Last 24 hours) at 06/06/2020 0913 Last data filed at 06/06/2020 0806 Gross per 24 hour  Intake 440 ml  Output 1900 ml  Net -1460 ml     Physical Exam  Awake Alert, No new F.N deficits, Normal affect [place].AT,PERRAL Supple Neck,No JVD, No cervical lymphadenopathy appriciated.  Symmetrical Chest wall movement, Good air movement bilaterally, CTAB RRR,No Gallops, Rubs or new Murmurs, No  Parasternal Heave +ve B.Sounds, Abd Soft, No tenderness, No organomegaly appriciated, No rebound - guarding or rigidity. No Cyanosis, Clubbing or edema, No new Rash or bruise     Data Review:    CBC Recent Labs  Lab 06/02/20 0246 06/03/20 0143 06/04/20 0107 06/05/20 0119 06/06/20 0618  WBC 7.5 9.4 13.3* 6.7 6.5  HGB 11.0* 10.9* 12.4* 10.9* 11.4*  HCT 32.5* 32.0* 36.7* 32.2* 34.0*  PLT 106* 132* 196 150 142*  MCV 100.3* 100.9* 101.7* 102.2* 102.7*  MCH 34.0 34.4* 34.3* 34.6* 34.4*  MCHC 33.8 34.1 33.8 33.9 33.5  RDW 12.7 12.7 12.7 12.8 12.9  LYMPHSABS 0.4* 0.6* 1.7 1.1 1.6  MONOABS 0.2 0.3 1.0 0.6 0.5  EOSABS 0.0 0.0 0.0 0.0 0.2  BASOSABS 0.0 0.0 0.0 0.0 0.0    Recent Labs  Lab 06/01/20 1432 06/01/20 1654 06/01/20 1913 06/02/20 0246 06/02/20 1022 06/02/20 1023 06/03/20 0143 06/04/20 0107 06/05/20 0119 06/06/20 0618  NA  --   --   --  131*  --   --  132* 135 137 141  K  --   --   --  4.1  --   --  4.4 3.7 3.4* 3.4*  CL  --   --   --  98  --   --  98 98 102 103  CO2  --   --   --  27  --   --  25 26 31  32  GLUCOSE  --   --   --  460*  --   --  312* 177* 141* 105*  BUN  --   --   --  15  --   --  25* 25* 20 15  CREATININE  --   --   --  0.93  --   --  0.79 0.82 0.83 0.70  CALCIUM  --   --   --  7.8*  --   --  8.0* 8.7* 8.1* 8.1*  AST   < >  --  39 30  --   --  32 60* 71* 68*  ALT   < >  --  26 25  --   --  24 35 41 43  ALKPHOS   < >  --  100 90  --   --  93 105 86 82  BILITOT   < >  --  2.4* 1.5*  --   --  1.2 1.3* 0.7 1.0  ALBUMIN   < >  --  2.6* 2.3*  --   --  2.2* 2.5* 2.1* 2.2*  MG  --   --   --  1.8  --   --  1.9 2.0 1.9  --   CRP   < >  --  18.6* 17.6*  --   --  13.8* 10.6* 6.5* 4.6*  DDIMER   < >  --  2.15* 1.94*  --   --  1.57* 1.82* 1.33* 1.29*  PROCALCITON  --   --  0.92  --   --  0.70 0.50 0.35 0.19  --   LATICACIDVEN  --  2.1* 2.9*  --   --   --   --   --   --   --   INR  --   --  1.5*  --   --   --   --   --   --   --   HGBA1C  --   --   --   10.9*  --   --   --   --   --   --   BNP  --   --  197.8*  --  4,243.4*  --  172.5* 156.3* 112.6*  --    < > = values in this interval not displayed.    ------------------------------------------------------------------------------------------------------------------ No results for input(s): CHOL, HDL, LDLCALC, TRIG, CHOLHDL, LDLDIRECT in the last 72 hours.  Lab Results  Component Value Date   HGBA1C 10.9 (H) 06/02/2020   ------------------------------------------------------------------------------------------------------------------ No results for input(s): TSH, T4TOTAL, T3FREE, THYROIDAB in the last 72 hours.  Invalid input(s): FREET3  Cardiac Enzymes No results for input(s): CKMB, TROPONINI, MYOGLOBIN in the last 168 hours.  Invalid input(s): CK ------------------------------------------------------------------------------------------------------------------    Component Value Date/Time   BNP 112.6 (H) 06/05/2020 0119    Micro Results Recent Results (from the past 240 hour(s))  Covid-19, Flu A+B ([facility])     Status: None   Collection Time: 06/01/20 12:54 PM   Specimen: Nasopharyngeal   Naso  Result Value Ref Range Status   SARS-CoV-2, NAA Not Detected Not Detected Final    Comment: This nucleic acid amplification test was developed and its performance characteristics determined by [employer]. Nucleic acid amplification tests include RT-PCR and TMA. This test has not been FDA cleared or approved. This test has been authorized by FDA under an Emergency Use Authorization (EUA). This test is only authorized for the duration of time the declaration that circumstances exist justifying the authorization of the emergency use of in vitro diagnostic tests for detection of SARS-CoV-2 virus and/or diagnosis of COVID-19 infection under section 564(b)(1) of the Act, 21 U.S.C. 443XVQ-0(G) (1), unless the authorization is terminated or revoked sooner. When diagnostic  testing is negative, the possibility of a false negative result should be considered in the context of a patient's recent exposures and the presence of clinical signs and symptoms consistent with COVID-19. An individual without symptoms of COVID-19 and who is not shedding SARS-CoV-2 virus wo uld expect to have a negative (not detected) result in this assay.    Influenza A, NAA Not Detected Not Detected Final   Influenza B, NAA Not Detected Not Detected Final  Resp Panel by RT-PCR (Flu A&B, Covid) Nasopharyngeal Swab     Status: Abnormal   Collection Time: 06/01/20  3:35 PM   Specimen: Nasopharyngeal Swab; Nasopharyngeal(NP) swabs in vial transport medium  Result Value Ref Range Status   SARS Coronavirus 2 by RT PCR POSITIVE (A) NEGATIVE Final    Comment: RESULT CALLED TO, READ BACK BY AND VERIFIED WITH: (NOTE) SARS-CoV-2 target nucleic acids are DETECTED.  The SARS-CoV-2 RNA is  generally detectable in upper respiratory specimens during the acute phase of infection. Positive results are indicative of the presence of the identified virus, but do not rule out bacterial infection or co-infection with other pathogens not detected by the test. Clinical correlation with patient history and other diagnostic information is necessary to determine patient infection status. The expected result is Negative.  Fact Sheet for Patients: EntrepreneurPulse.com.au  Fact Sheet for Healthcare Providers: IncredibleEmployment.be  This test is not yet approved or cleared by the [place] FDA and  has been authorized for detection and/or diagnosis of SARS-CoV-2 by FDA under an Emergency Use Authorization (EUA).  This EUA will remain in effect (meaning this test can be used) for the duration of  the [place] VID-19 declaration under Section 564(b)(1) of the Act, 21 U.S.C. section 360bbb-3(b)(1), unless the authorization is terminated or revoked sooner.     Influenza A  by PCR NEGATIVE NEGATIVE Final    Comment: A,OLEARY @1735  06/01/20 EB   Influenza B by PCR NEGATIVE NEGATIVE Final    Comment: (NOTE) The Xpert Xpress SARS-CoV-2/FLU/RSV plus assay is intended as an aid in the diagnosis of influenza from Nasopharyngeal swab specimens and should not be used as a sole basis for treatment. Nasal washings and aspirates are unacceptable for Xpert Xpress SARS-CoV-2/FLU/RSV testing.  Fact Sheet for Patients: EntrepreneurPulse.com.au  Fact Sheet for Healthcare Providers: IncredibleEmployment.be  This test is not yet approved or cleared by the [place] FDA and has been authorized for detection and/or diagnosis of SARS-CoV-2 by FDA under an Emergency Use Authorization (EUA). This EUA will remain in effect (meaning this test can be used) for the duration of the COVID-19 declaration under Section 564(b)(1) of the Act, 21 U.S.C. section 360bbb-3(b)(1), unless the authorization is terminated or revoked.  Performed at [facility], [place] [address] [place], [place] [address]   Blood culture (routine x 2)     Status: Abnormal   Collection Time: 06/01/20  4:00 PM   Specimen: BLOOD RIGHT FOREARM  Result Value Ref Range Status   Specimen Description BLOOD RIGHT FOREARM  Final   Special Requests   Final    BOTTLES DRAWN AEROBIC AND ANAEROBIC Blood Culture adequate volume   Culture  Setup Time   Final    GRAM POSITIVE COCCI IN CLUSTERS IN BOTH AEROBIC AND ANAEROBIC BOTTLES Organism ID to follow CRITICAL RESULT CALLED TO, READ BACK BY AND VERIFIED WITHSalli Real, AT 0603 06/02/20 Rush Landmark Performed at [facility], [place] [address] [place], [place] [address]    Culture STAPHYLOCOCCUS AUREUS (A)  Final   Report Status 06/04/2020 FINAL  Final   Organism ID, Bacteria STAPHYLOCOCCUS AUREUS  Final      Susceptibility   Staphylococcus aureus - MIC*    CIPROFLOXACIN <=0.5 SENSITIVE Sensitive     ERYTHROMYCIN  <=0.25 SENSITIVE Sensitive     GENTAMICIN <=0.5 SENSITIVE Sensitive     OXACILLIN <=0.25 SENSITIVE Sensitive     TETRACYCLINE <=1 SENSITIVE Sensitive     VANCOMYCIN 1 SENSITIVE Sensitive     TRIMETH/SULFA <=10 SENSITIVE Sensitive     CLINDAMYCIN <=0.25 SENSITIVE Sensitive     RIFAMPIN <=0.5 SENSITIVE Sensitive     Inducible Clindamycin NEGATIVE Sensitive     * STAPHYLOCOCCUS AUREUS  Blood Culture ID Panel (Reflexed)     Status: Abnormal   Collection Time: 06/01/20  4:00 PM  Result Value Ref Range Status   Enterococcus faecalis NOT DETECTED NOT DETECTED Final   Enterococcus  Faecium NOT DETECTED NOT DETECTED Final   Listeria monocytogenes NOT DETECTED NOT DETECTED Final   Staphylococcus species DETECTED (A) NOT DETECTED Final    Comment: CRITICAL RESULT CALLED TO, READ BACK BY AND VERIFIED WITH: G. ABBOTT PHARMD, AT 0603 06/02/20 D. VANHOOK    Staphylococcus aureus (BCID) DETECTED (A) NOT DETECTED Final    Comment: CRITICAL RESULT CALLED TO, READ BACK BY AND VERIFIED WITH: G. ABBOTT PHARMD, AT 0603 06/02/20 D. VANHOOK    Staphylococcus epidermidis NOT DETECTED NOT DETECTED Final   Staphylococcus lugdunensis NOT DETECTED NOT DETECTED Final   Streptococcus species NOT DETECTED NOT DETECTED Final   Streptococcus agalactiae NOT DETECTED NOT DETECTED Final   Streptococcus pneumoniae NOT DETECTED NOT DETECTED Final   Streptococcus pyogenes NOT DETECTED NOT DETECTED Final   A.calcoaceticus-baumannii NOT DETECTED NOT DETECTED Final   Bacteroides fragilis NOT DETECTED NOT DETECTED Final   Enterobacterales NOT DETECTED NOT DETECTED Final   Enterobacter cloacae complex NOT DETECTED NOT DETECTED Final   Escherichia coli NOT DETECTED NOT DETECTED Final   Klebsiella aerogenes NOT DETECTED NOT DETECTED Final   Klebsiella oxytoca NOT DETECTED NOT DETECTED Final   Klebsiella pneumoniae NOT DETECTED NOT DETECTED Final   Proteus species NOT DETECTED NOT DETECTED Final   Salmonella species NOT  DETECTED NOT DETECTED Final   Serratia marcescens NOT DETECTED NOT DETECTED Final   Haemophilus influenzae NOT DETECTED NOT DETECTED Final   Neisseria meningitidis NOT DETECTED NOT DETECTED Final   Pseudomonas aeruginosa NOT DETECTED NOT DETECTED Final   Stenotrophomonas maltophilia NOT DETECTED NOT DETECTED Final   Candida albicans NOT DETECTED NOT DETECTED Final   Candida auris NOT DETECTED NOT DETECTED Final   Candida glabrata NOT DETECTED NOT DETECTED Final   Candida krusei NOT DETECTED NOT DETECTED Final   Candida parapsilosis NOT DETECTED NOT DETECTED Final   Candida tropicalis NOT DETECTED NOT DETECTED Final   Cryptococcus neoformans/gattii NOT DETECTED NOT DETECTED Final   Meth resistant mecA/C and MREJ NOT DETECTED NOT DETECTED Final    Comment: Performed at [facility], [address], [place], [place] [address]  Blood culture (routine x 2)     Status: Abnormal   Collection Time: 06/01/20  4:02 PM   Specimen: BLOOD LEFT FOREARM  Result Value Ref Range Status   Specimen Description BLOOD LEFT FOREARM  Final   Special Requests   Final    BOTTLES DRAWN AEROBIC AND ANAEROBIC Blood Culture adequate volume   Culture  Setup Time   Final    GRAM POSITIVE COCCI IN CLUSTERS IN BOTH AEROBIC AND ANAEROBIC BOTTLES    Culture (A)  Final    STAPHYLOCOCCUS AUREUS SUSCEPTIBILITIES PERFORMED ON PREVIOUS CULTURE WITHIN THE LAST 5 DAYS. Performed at [facility], [place] [address] [place], [place] [address]    Report Status 06/04/2020 FINAL  Final  Culture, blood (routine x 2)     Status: None (Preliminary result)   Collection Time: 06/03/20  9:13 AM   Specimen: BLOOD  Result Value Ref Range Status   Specimen Description BLOOD LEFT ANTECUBITAL  Final   Special Requests   Final    BOTTLES DRAWN AEROBIC AND ANAEROBIC Blood Culture adequate volume   Culture   Final    NO GROWTH 2 DAYS Performed at [facility], [place] [address], [place], [place] [address]    Report Status  PENDING  Incomplete  Culture, blood (routine x 2)     Status: None (Preliminary result)   Collection  Time: 06/03/20  9:14 AM   Specimen: BLOOD  Result Value Ref Range Status   Specimen Description BLOOD BLOOD LEFT HAND  Final   Special Requests   Final    BOTTLES DRAWN AEROBIC AND ANAEROBIC Blood Culture adequate volume   Culture   Final    NO GROWTH 2 DAYS Performed at [facility], [address] [place], [place] [address]    Report Status PENDING  Incomplete    Radiology Reports DG Chest 2 View  Result Date: 06/01/2020 CLINICAL DATA:  Shortness of breath EXAM: CHEST - 2 VIEW COMPARISON:  06/01/2020 prior studies FINDINGS: The cardiomediastinal silhouette is unremarkable. Minimal pulmonary vascular congestion present. There is no evidence of focal airspace disease, pulmonary edema, suspicious pulmonary nodule/mass, pleural effusion, or pneumothorax. No acute bony abnormalities are identified. IMPRESSION: Minimal pulmonary vascular congestion. Electronically Signed   By: Margarette Canada M.D.   On: 06/01/2020 14:59   DG Chest 2 View  Result Date: 06/01/2020 CLINICAL DATA:  Cough and shortness of breath EXAM: CHEST - 2 VIEW COMPARISON:  05/29/20 FINDINGS: The heart size and mediastinal contours are within normal limits. Both lungs are clear. Increased pulmonary vascular congestion without overt edema. No pleural effusion. The visualized skeletal structures are unremarkable. IMPRESSION: Increased pulmonary vascular congestion without overt edema. Electronically Signed   By: Kerby Moors M.D.   On: 06/01/2020 13:12   DG Chest 2 View  Result Date: 05/29/2020 CLINICAL DATA:  Chest pain and shortness of breath beginning yesterday. Intermittent hemoptysis. EXAM: CHEST - 2 VIEW COMPARISON:  01/04/2015 FINDINGS: The heart size and mediastinal contours are within normal limits. Both lungs are clear. The visualized skeletal structures are unremarkable. IMPRESSION: No active cardiopulmonary  disease. Electronically Signed   By: Marlaine Hind M.D.   On: 05/29/2020 13:48   CT Angio Chest PE W/Cm &/Or Wo Cm  Result Date: 05/29/2020 CLINICAL DATA:  Chest pain. EXAM: CT ANGIOGRAPHY CHEST WITH CONTRAST TECHNIQUE: Multidetector CT imaging of the chest was performed using the standard protocol during bolus administration of intravenous contrast. Multiplanar CT image reconstructions and MIPs were obtained to evaluate the vascular anatomy. CONTRAST:  136mL OMNIPAQUE IOHEXOL 350 MG/ML SOLN COMPARISON:  January 04, 2015. FINDINGS: Cardiovascular: Satisfactory opacification of the pulmonary arteries to the segmental level. No evidence of pulmonary embolism. Normal heart size. No pericardial effusion. Mediastinum/Nodes: No enlarged mediastinal, hilar, or axillary lymph nodes. Thyroid gland, trachea, and esophagus demonstrate no significant findings. Lungs/Pleura: No pneumothorax or pleural effusion is noted. 5 mm nodule is noted in left lower lobe best seen on image number 53 of series 5. 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 Mild splenomegaly is noted. Musculoskeletal: No chest wall abnormality. No acute or significant osseous findings. Review of the MIP images confirms the above findings. IMPRESSION: 1. No definite evidence of pulmonary embolus. 2. 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 Mild splenomegaly is noted. 3. 5 mm left lower lobe nodule is noted. No follow-up needed if patient is low-risk. Non-contrast chest CT can be considered in 12 months if patient is high-risk. This recommendation follows the consensus statement: Guidelines for Management of Incidental Pulmonary Nodules Detected on CT Images: From the Fleischner Society 2017; Radiology 2017; 284:228-243. Electronically Signed   By: Marijo Conception M.D.   On: 05/29/2020 15:52   US Venous Img Lower Unilateral Left  Result Date: 05/29/2020 CLINICAL DATA:  Left leg pain EXAM: LEFT LOWER EXTREMITY VENOUS DOPPLER ULTRASOUND TECHNIQUE: Gray-scale  sonography with graded compression, as well  as color Doppler and duplex ultrasound were performed to evaluate the lower extremity deep venous systems from the level of the common femoral vein and including the common femoral, femoral, profunda femoral, popliteal and calf veins including the posterior tibial, peroneal and gastrocnemius veins when visible. The superficial great saphenous vein was also interrogated. Spectral Doppler was utilized to evaluate flow at rest and with distal augmentation maneuvers in the common femoral, femoral and popliteal veins. COMPARISON:  None. FINDINGS: Contralateral Common Femoral Vein: Respiratory phasicity is normal and symmetric with the symptomatic side. No evidence of thrombus. Normal compressibility. Common Femoral Vein: No evidence of thrombus. Normal compressibility, respiratory phasicity and response to augmentation. Saphenofemoral Junction: No evidence of thrombus. Normal compressibility and flow on color Doppler imaging. Profunda Femoral Vein: No evidence of thrombus. Normal compressibility and flow on color Doppler imaging. Femoral Vein: No evidence of thrombus. Normal compressibility, respiratory phasicity and response to augmentation. Popliteal Vein: No evidence of thrombus. Normal compressibility, respiratory phasicity and response to augmentation. Calf Veins: No evidence of thrombus. Normal compressibility and flow on color Doppler imaging. Superficial Great Saphenous Vein: No evidence of thrombus. Normal compressibility. Venous Reflux:  None. Other Findings:  None. IMPRESSION: No evidence of deep venous thrombosis. Electronically Signed   By: Inez Catalina M.D.   On: 05/29/2020 15:57   DG Chest Port 1 View  Result Date: 06/02/2020 CLINICAL DATA:  60 year old male with shortness of breath, history of COVID 19. EXAM: PORTABLE CHEST - 1 VIEW COMPARISON:  06/01/2020 FINDINGS: The mediastinal contours are within normal limits. No cardiomegaly. The lungs are clear  bilaterally without evidence of focal consolidation, pleural effusion, or pneumothorax. No acute osseous abnormality. IMPRESSION: No acute cardiopulmonary process. Electronically Signed   By: Ruthann Cancer MD   On: 06/02/2020 08:47   ECHOCARDIOGRAM COMPLETE  Result Date: 06/03/2020    ECHOCARDIOGRAM REPORT   Patient Name:   NORIS KULINSKI Date of Exam: 06/03/2020 Medical Rec #:  [id number]        Height:       74.0 in Accession #:    [id number]       Weight:       261.4 lb Date of Birth:  Jan 11, 1961        BSA:          2.435 m Patient Age:    73 years         BP:           118/62 mmHg Patient Gender: M                HR:           99 bpm. Exam Location:  Inpatient Procedure: 2D Echo Indications:    Bacteremia R78.81  History:        Patient has no prior history of Echocardiogram examinations.                 Risk Factors:Diabetes, Dyslipidemia and Former Smoker. COVID 19,                 Staph aureus bactermia.  Sonographer:    Leavy Cella Referring Phys: [place]  1. Left ventricular ejection fraction, by estimation, is 60 to 65%. The left ventricle has normal function. The left ventricle has no regional wall motion abnormalities. Left ventricular diastolic function could not be evaluated.  2. Right ventricular systolic function is normal. The right ventricular size is normal.  3. The mitral valve is normal  in structure. No evidence of mitral valve regurgitation. No evidence of mitral stenosis.  4. The aortic valve is tricuspid. Aortic valve regurgitation is not visualized. No aortic stenosis is present. FINDINGS  Left Ventricle: Left ventricular ejection fraction, by estimation, is 60 to 65%. The left ventricle has normal function. The left ventricle has no regional wall motion abnormalities. The left ventricular internal cavity size was normal in size. There is  no left ventricular hypertrophy. Left ventricular diastolic function could not be evaluated. Right Ventricle: The right  ventricular size is normal. No increase in right ventricular wall thickness. Right ventricular systolic function is normal. Left Atrium: Left atrial size was normal in size. Right Atrium: Right atrial size was normal in size. Pericardium: There is no evidence of pericardial effusion. Mitral Valve: The mitral valve is normal in structure. Mild mitral annular calcification. No evidence of mitral valve regurgitation. No evidence of mitral valve stenosis. Tricuspid Valve: The tricuspid valve is normal in structure. Tricuspid valve regurgitation is not demonstrated. No evidence of tricuspid stenosis. Aortic Valve: The aortic valve is tricuspid. Aortic valve regurgitation is not visualized. No aortic stenosis is present. Pulmonic Valve: The pulmonic valve was normal in structure. Pulmonic valve regurgitation is not visualized. No evidence of pulmonic stenosis. Aorta: The aortic root is normal in size and structure. Venous: The inferior vena cava was not well visualized. IAS/Shunts: No atrial level shunt detected by color flow Doppler. Skeet Latch MD Electronically signed by Skeet Latch MD Signature Date/Time: 06/03/2020/6:27:37 PM    Final    [place] EKG SITE RITE  Result Date: 06/05/2020 If Site Rite image not attached, placement could not be confirmed due to current cardiac rhythm.

## 2020-06-06 NOTE — Anesthesia Preprocedure Evaluation (Signed)
Anesthesia Evaluation  Patient identified by MRN, date of birth, ID band Patient awake    Reviewed: Allergy & Precautions, NPO status , Patient's Chart, lab work & pertinent test results  Airway Mallampati: II  TM Distance: <3 FB Neck ROM: Full    Dental  (+) Teeth Intact, Dental Advisory Given   Pulmonary former smoker,  Covid +   Pulmonary exam normal breath sounds clear to auscultation       Cardiovascular negative cardio ROS Normal cardiovascular exam Rhythm:Regular Rate:Normal     Neuro/Psych negative neurological ROS     GI/Hepatic negative GI ROS, Neg liver ROS,   Endo/Other  diabetesObesity   Renal/GU negative Renal ROS     Musculoskeletal negative musculoskeletal ROS (+)   Abdominal   Peds  Hematology  (+) Blood dyscrasia (Thrombocytopenia), anemia ,   Anesthesia Other Findings Day of surgery medications reviewed with the patient.  Reproductive/Obstetrics                             Anesthesia Physical Anesthesia Plan  ASA: III  Anesthesia Plan: General   Post-op Pain Management:    Induction: Intravenous, Rapid sequence and Cricoid pressure planned  PONV Risk Score and Plan: 2 and Treatment may vary due to age or medical condition  Airway Management Planned: Oral ETT  Additional Equipment:   Intra-op Plan:   Post-operative Plan: Extubation in OR  Informed Consent: I have reviewed the patients History and Physical, chart, labs and discussed the procedure including the risks, benefits and alternatives for the proposed anesthesia with the patient or authorized representative who has indicated his/her understanding and acceptance.     Dental advisory given  Plan Discussed with: CRNA  Anesthesia Plan Comments: (Pre-op eval completed after induction due to COVID+ status )        Anesthesia Quick Evaluation

## 2020-06-06 NOTE — Transfer of Care (Signed)
Immediate Anesthesia Transfer of Care Note  Patient: Travis Hampton  Procedure(s) Performed: TRANSESOPHAGEAL ECHOCARDIOGRAM (TEE) (N/A )  Patient Location: Endoscopy Unit  Anesthesia Type:General  Level of Consciousness: drowsy and patient cooperative  Airway & Oxygen Therapy: Patient Spontanous Breathing and Patient connected to face mask oxygen  Post-op Assessment: Report given to RN and Post -op Vital signs reviewed and stable  Post vital signs: Reviewed and stable  Last Vitals:  Vitals Value Taken Time  BP 123/70 1405  Temp    Pulse 86 1404  Resp 14 1404  SpO2 98 1404    Last Pain:  Vitals:   06/06/20 1255  TempSrc: Oral  PainSc: 0-No pain         Complications: No complications documented.

## 2020-06-06 NOTE — Progress Notes (Signed)
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  He will need 6 weeks of IV cefazolin.  Diagnosis: MV vegetation  Culture Result: MSSA  No Known Allergies  OPAT Orders Discharge antibiotics to be given via PICC line Discharge antibiotics: IV cefazolin 2 grams three times a day Per pharmacy protocol yes Duration: 6 weeks End Date: Jul 14 2020  [place] Per Protocol: yes  Home health RN for IV administration and teaching; PICC line care and labs.    Labs weekly while on IV antibiotics: _x_ CBC with differential __ BMP _x_ CMP __ CRP __ ESR __ Vancomycin trough __ CK  _x_ Please pull PIC at completion of IV antibiotics __ Please leave PIC in place until doctor has seen patient or been notified  Fax weekly labs to [phone]  Clinic Follow Up Appt: 06/19/20   @ 11:15 with Dr. Linus Salmons

## 2020-06-06 NOTE — Anesthesia Postprocedure Evaluation (Signed)
Anesthesia Post Note  Patient: Travis Hampton  Procedure(s) Performed: TRANSESOPHAGEAL ECHOCARDIOGRAM (TEE) (N/A )     Patient location during evaluation: PACU Anesthesia Type: General Level of consciousness: awake and alert, awake and oriented Pain management: pain level controlled Vital Signs Assessment: post-procedure vital signs reviewed and stable Respiratory status: spontaneous breathing, nonlabored ventilation and respiratory function stable Cardiovascular status: blood pressure returned to baseline and stable Postop Assessment: no apparent nausea or vomiting Anesthetic complications: no   No complications documented.  Last Vitals:  Vitals:   06/06/20 1435 06/06/20 1506  BP: 124/65 (!) 156/124  Pulse: 91 99  Resp: (!) 21 19  Temp:  37.1 C  SpO2: 92% 95%    Last Pain:  Vitals:   06/06/20 1435  TempSrc:   PainSc: 0-No pain                 Catalina Gravel

## 2020-06-06 NOTE — TOC Progression Note (Signed)
Transition of Care [facility]) - Progression Note    Patient Details  Name: Travis Hampton MRN: [id number] Date of Birth: 02/14/1961  Transition of Care [facility]) CM/SW Contact  Bartholomew Crews, RN Phone Number: [phone] 06/06/2020, 4:04 PM  Clinical Narrative:     Notified by Jeannene Patella with [employer] of need for IV antibiotics. Pam to reach out to [facility] for infusion nursing. TOC following for transition needs.        Expected Discharge Plan and Services                                                 Social Determinants of Health (SDOH) Interventions    Readmission Risk Interventions No flowsheet data found.

## 2020-06-06 NOTE — Progress Notes (Signed)
Spoke with Charlena Cross, RN regarding PICC insertion.  PICC RN will be at bedside to place PICC in the morning.

## 2020-06-06 NOTE — Plan of Care (Signed)
  Problem: Health Behavior/Discharge Planning: Goal: Ability to manage health-related needs will improve Outcome: Progressing   Problem: Clinical Measurements: Goal: Will remain free from infection Outcome: Progressing Goal: Cardiovascular complication will be avoided Outcome: Progressing   

## 2020-06-06 NOTE — Progress Notes (Signed)
PHARMACY CONSULT NOTE FOR:  OUTPATIENT  PARENTERAL ANTIBIOTIC THERAPY (OPAT)  Indication: endocarditis  Regimen: cefazolin 2g IV q8h   End date: 07/14/2020  IV antibiotic discharge orders are pended. To discharging provider:  please sign these orders via discharge navigator,  Select New Orders & click on the button choice - Manage This Unsigned Work.     Thank you for allowing pharmacy to be a part of this patient's care.  Phillis Haggis 06/06/2020, 3:25 PM

## 2020-06-07 DIAGNOSIS — R509 Fever, unspecified: Secondary | ICD-10-CM | POA: Diagnosis not present

## 2020-06-07 DIAGNOSIS — R7881 Bacteremia: Secondary | ICD-10-CM | POA: Diagnosis not present

## 2020-06-07 DIAGNOSIS — U071 COVID-19: Secondary | ICD-10-CM | POA: Diagnosis not present

## 2020-06-07 DIAGNOSIS — B9561 Methicillin susceptible Staphylococcus aureus infection as the cause of diseases classified elsewhere: Secondary | ICD-10-CM | POA: Diagnosis not present

## 2020-06-07 LAB — BASIC METABOLIC PANEL
Anion gap: 8 (ref 5–15)
BUN: 14 mg/dL (ref 8–23)
CO2: 29 mmol/L (ref 22–32)
Calcium: 8.1 mg/dL — ABNORMAL LOW (ref 8.9–10.3)
Chloride: 97 mmol/L — ABNORMAL LOW (ref 98–111)
Creatinine, Ser: 0.72 mg/dL (ref 0.61–1.24)
GFR, Estimated: >60 mL/min (ref >60)
Glucose, Bld: 214 mg/dL — ABNORMAL HIGH (ref 70–99)
Potassium: 3.7 mmol/L (ref 3.5–5.1)
Sodium: 134 mmol/L — ABNORMAL LOW (ref 135–145)

## 2020-06-07 LAB — GLUCOSE, CAPILLARY
Glucose-Capillary: 227 mg/dL — ABNORMAL HIGH (ref 70–99)
Glucose-Capillary: 222 mg/dL — ABNORMAL HIGH (ref 70–99)

## 2020-06-07 LAB — MAGNESIUM: Magnesium: 1.8 mg/dL (ref 1.7–2.4)

## 2020-06-07 MED ORDER — CEFAZOLIN IV (FOR PTA / DISCHARGE USE ONLY): 2.0000 g | Freq: Three times a day (TID) | INTRAVENOUS | 0 refills | Status: AC

## 2020-06-07 MED ORDER — CHLORHEXIDINE GLUCONATE CLOTH 2 % EX PADS
6.0000 | MEDICATED_PAD | Freq: Every day | CUTANEOUS | Status: DC
  Administered 2020-06-07: 6 via TOPICAL

## 2020-06-07 MED ORDER — INSULIN GLARGINE 100 UNIT/ML ~~LOC~~ SOLN: 15.0000 [IU] | Freq: Every day | SUBCUTANEOUS | 0 refills | Status: DC

## 2020-06-07 MED ORDER — SODIUM CHLORIDE 0.9% FLUSH: 10.0000 mL | INTRAVENOUS | Status: DC | PRN

## 2020-06-07 MED ORDER — BLOOD GLUCOSE MONITOR KIT: PACK | 1 refills | Status: AC

## 2020-06-07 MED ORDER — "INSULIN SYRINGE-NEEDLE U-100 25G X 1"" 1 ML MISC": 0 refills | Status: DC

## 2020-06-07 MED ORDER — INSULIN ASPART 100 UNIT/ML ~~LOC~~ SOLN: SUBCUTANEOUS | 0 refills | Status: DC

## 2020-06-07 NOTE — Discharge Instructions (Signed)
Follow with Primary MD Reynold Bowen, MD in 7 days   Get CBC, CMP, 2 view Chest X ray -  checked next visit within 1 week by Primary MD    Activity: As tolerated with Full fall precautions use walker/cane & assistance as needed  Disposition Home    Diet: Heart Healthy Low Carb  Accuchecks 4 times/day, Once in AM empty stomach and then before each meal. Log in all results and show them to your Prim.MD in 3 days. If any glucose reading is under 80 or above 300 call your Prim MD immidiately. Follow Low glucose instructions for glucose under 80 as instructed.   Special Instructions: If you have smoked or chewed Tobacco  in the last 2 yrs please stop smoking, stop any regular Alcohol  and or any Recreational drug use.  On your next visit with your primary care physician please Get Medicines reviewed and adjusted.  Please request your Prim.MD to go over all Hospital Tests and Procedure/Radiological results at the follow up, please get all Hospital records sent to your Prim MD by signing hospital release before you go home.  If you experience worsening of your admission symptoms, develop shortness of breath, life threatening emergency, suicidal or homicidal thoughts you must seek medical attention immediately by calling 911 or calling your MD immediately  if symptoms less severe.  You Must read complete instructions/literature along with all the possible adverse reactions/side effects for all the Medicines you take and that have been prescribed to you. Take any new Medicines after you have completely understood and accpet all the possible adverse reactions/side effects.         Person Under Monitoring Name: Travis Hampton  Location: [address] [place] [address]   Infection Prevention Recommendations for Individuals Confirmed to have, or Being Evaluated for, 2019 Novel Coronavirus (COVID-19) Infection Who Receive Care at Home  Individuals who are confirmed to have,  or are being evaluated for, COVID-19 should follow the prevention steps below until a healthcare provider or local or state health department says they can return to normal activities.  Stay home except to get medical care You should restrict activities outside your home, except for getting medical care. Do not go to work, school, or public areas, and do not use public transportation or taxis.  Call ahead before visiting your doctor Before your medical appointment, call the healthcare provider and tell them that you have, or are being evaluated for, COVID-19 infection. This will help the healthcare provider's office take steps to keep other people from getting infected. Ask your healthcare provider to call the local or state health department.  Monitor your symptoms Seek prompt medical attention if your illness is worsening (e.g., difficulty breathing). Before going to your medical appointment, call the healthcare provider and tell them that you have, or are being evaluated for, COVID-19 infection. Ask your healthcare provider to call the local or state health department.  Wear a facemask You should wear a facemask that covers your nose and mouth when you are in the same room with other people and when you visit a healthcare provider. People who live with or visit you should also wear a facemask while they are in the same room with you.  Separate yourself from other people in your home As much as possible, you should stay in a different room from other people in your home. Also, you should use a separate bathroom, if available.  Avoid sharing household items You should  not share dishes, drinking glasses, cups, eating utensils, towels, bedding, or other items with other people in your home. After using these items, you should wash them thoroughly with soap and water.  Cover your coughs and sneezes Cover your mouth and nose with a tissue when you cough or sneeze, or you can cough or  sneeze into your sleeve. Throw used tissues in a lined trash can, and immediately wash your hands with soap and water for at least 20 seconds or use an alcohol-based hand rub.  Wash your [facility] your hands often and thoroughly with soap and water for at least 20 seconds. You can use an alcohol-based hand sanitizer if soap and water are not available and if your hands are not visibly dirty. Avoid touching your eyes, nose, and mouth with unwashed hands.   Prevention Steps for Caregivers and Household Members of Individuals Confirmed to have, or Being Evaluated for, COVID-19 Infection Being Cared for in the Home  If you live with, or provide care at home for, a person confirmed to have, or being evaluated for, COVID-19 infection please follow these guidelines to prevent infection:  Follow healthcare provider's instructions Make sure that you understand and can help the patient follow any healthcare provider instructions for all care.  Provide for the patient's basic needs You should help the patient with basic needs in the home and provide support for getting groceries, prescriptions, and other personal needs.  Monitor the patient's symptoms If they are getting sicker, call his or her medical provider and tell them that the patient has, or is being evaluated for, COVID-19 infection. This will help the healthcare provider's office take steps to keep other people from getting infected. Ask the healthcare provider to call the local or state health department.  Limit the number of people who have contact with the patient  If possible, have only one caregiver for the patient.  Other household members should stay in another home or place of residence. If this is not possible, they should stay  in another room, or be separated from the patient as much as possible. Use a separate bathroom, if available.  Restrict visitors who do not have an essential need to be in the home.  Keep older  adults, very young children, and other sick people away from the patient Keep older adults, very young children, and those who have compromised immune systems or chronic health conditions away from the patient. This includes people with chronic heart, lung, or kidney conditions, diabetes, and cancer.  Ensure good ventilation Make sure that shared spaces in the home have good air flow, such as from an air conditioner or an opened window, weather permitting.  Wash your hands often  Wash your hands often and thoroughly with soap and water for at least 20 seconds. You can use an alcohol based hand sanitizer if soap and water are not available and if your hands are not visibly dirty.  Avoid touching your eyes, nose, and mouth with unwashed hands.  Use disposable paper towels to dry your hands. If not available, use dedicated cloth towels and replace them when they become wet.  Wear a facemask and gloves  Wear a disposable facemask at all times in the room and gloves when you touch or have contact with the patient's blood, body fluids, and/or secretions or excretions, such as sweat, saliva, sputum, nasal mucus, vomit, urine, or feces.  Ensure the mask fits over your nose and mouth tightly, and do not touch  it during use.  Throw out disposable facemasks and gloves after using them. Do not reuse.  Wash your hands immediately after removing your facemask and gloves.  If your personal clothing becomes contaminated, carefully remove clothing and launder. Wash your hands after handling contaminated clothing.  Place all used disposable facemasks, gloves, and other waste in a lined container before disposing them with other household waste.  Remove gloves and wash your hands immediately after handling these items.  Do not share dishes, glasses, or other household items with the patient  Avoid sharing household items. You should not share dishes, drinking glasses, cups, eating utensils, towels,  bedding, or other items with a patient who is confirmed to have, or being evaluated for, COVID-19 infection.  After the person uses these items, you should wash them thoroughly with soap and water.  Wash laundry thoroughly  Immediately remove and wash clothes or bedding that have blood, body fluids, and/or secretions or excretions, such as sweat, saliva, sputum, nasal mucus, vomit, urine, or feces, on them.  Wear gloves when handling laundry from the patient.  Read and follow directions on labels of laundry or clothing items and detergent. In general, wash and dry with the warmest temperatures recommended on the label.  Clean all areas the individual has used often  Clean all touchable surfaces, such as counters, tabletops, doorknobs, bathroom fixtures, toilets, phones, keyboards, tablets, and bedside tables, every day. Also, clean any surfaces that may have blood, body fluids, and/or secretions or excretions on them.  Wear gloves when cleaning surfaces the patient has come in contact with.  Use a diluted bleach solution (e.g., dilute bleach with 1 part bleach and 10 parts water) or a household disinfectant with a label that says EPA-registered for coronaviruses. To make a bleach solution at home, add 1 tablespoon of bleach to 1 quart (4 cups) of water. For a larger supply, add  cup of bleach to 1 gallon (16 cups) of water.  Read labels of cleaning products and follow recommendations provided on product labels. Labels contain instructions for safe and effective use of the cleaning product including precautions you should take when applying the product, such as wearing gloves or eye protection and making sure you have good ventilation during use of the product.  Remove gloves and wash hands immediately after cleaning.  Monitor yourself for signs and symptoms of illness Caregivers and household members are considered close contacts, should monitor their health, and will be asked to  limit movement outside of the home to the extent possible. Follow the monitoring steps for close contacts listed on the symptom monitoring form.   ? If you have additional questions, contact your local health department or call the epidemiologist on call at [phone] (available 24/7). ? This guidance is subject to change. For the most up-to-date guidance from [facility], please refer to their website: YouBlogs.pl

## 2020-06-07 NOTE — Progress Notes (Signed)
Peripherally Inserted Central Catheter Placement  The IV Nurse has discussed with the patient and/or persons authorized to consent for the patient, the purpose of this procedure and the potential benefits and risks involved with this procedure.  The benefits include less needle sticks, lab draws from the catheter, and the patient may be discharged home with the catheter. Risks include, but not limited to, infection, bleeding, blood clot (thrombus formation), and puncture of an artery; nerve damage and irregular heartbeat and possibility to perform a PICC exchange if needed/ordered by physician.  Alternatives to this procedure were also discussed.  Bard Power PICC patient education guide, fact sheet on infection prevention and patient information card has been provided to patient /or left at bedside.    PICC Placement Documentation  PICC Single Lumen 06/07/20 PICC Right Cephalic 40 cm 0 cm (Active)  Indication for Insertion or Continuance of Line Home intravenous therapies (PICC only) 06/07/20 1032  Exposed Catheter (cm) 0 cm 06/07/20 1032  Site Assessment Clean;Dry;Intact 06/07/20 1032  Line Status Flushed;Saline locked;Blood return noted 06/07/20 1032  Dressing Type Transparent;Securing device 06/07/20 1032  Antimicrobial disc in place? Yes 06/07/20 1032  Safety Lock Not Applicable 75/17/00 1749  Dressing Change Due 06/14/20 06/07/20 1032       Travis Hampton 06/07/2020, 10:35 AM

## 2020-06-07 NOTE — Progress Notes (Signed)
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  Afebrile, WBC has remained wnl.  No complaints.   No associated n/v/d.  No rash.   Physical Exam: Constitutional:  Vitals:   06/07/20 0331 06/07/20 1136  BP: (!) 108/58 137/61  Pulse: 66 84  Resp: 16 20  Temp: 97.8 F (36.6 C) 98.4 F (36.9 C)  SpO2: 94% 99%   patient appears in NAD Respiratory: Normal respiratory effort; CTA B Cardiovascular: RRR GI: soft, nt, nd  Review of Systems: Constitutional: negative for fevers and chills Gastrointestinal: negative for nausea and diarrhea Integument/breast: negative for rash Musculoskeletal: negative for myalgias and arthralgias  Lab Results  Component Value Date   WBC 6.5 06/06/2020   HGB 11.4 (L) 06/06/2020   HCT 34.0 (L) 06/06/2020   MCV 102.7 (H) 06/06/2020   PLT 142 (L) 06/06/2020    Lab Results  Component Value Date   CREATININE 0.72 06/07/2020   BUN 14 06/07/2020   NA 134 (L) 06/07/2020   K 3.7 06/07/2020   CL 97 (L) 06/07/2020   CO2 29 06/07/2020    Lab Results  Component Value Date   ALT 43 06/06/2020   AST 68 (H) 06/06/2020   ALKPHOS 82 06/06/2020     Microbiology: Recent Results (from the past 240 hour(s))  Covid-19, Flu A+B ([facility])     Status: None   Collection Time: 06/01/20 12:54 PM   Specimen: Nasopharyngeal   Naso  Result Value Ref Range Status   SARS-CoV-2, NAA Not Detected Not Detected Final    Comment: This nucleic acid amplification test was developed and its performance characteristics determined by [employer]. Nucleic acid amplification tests include RT-PCR and TMA. This test has not been FDA cleared or approved. This test has been authorized by FDA under an Emergency Use Authorization (EUA). This test is only authorized for the duration of time the declaration that circumstances exist justifying the authorization of the  emergency use of in vitro diagnostic tests for detection of SARS-CoV-2 virus and/or diagnosis of COVID-19 infection under section 564(b)(1) of the Act, 21 U.S.C. 408XKG-8(J) (1), unless the authorization is terminated or revoked sooner. When diagnostic testing is negative, the possibility of a false negative result should be considered in the context of a patient's recent exposures and the presence of clinical signs and symptoms consistent with COVID-19. An individual without symptoms of COVID-19 and who is not shedding SARS-CoV-2 virus wo uld expect to have a negative (not detected) result in this assay.    Influenza A, NAA Not Detected Not Detected Final   Influenza B, NAA Not Detected Not Detected Final  Resp Panel by RT-PCR (Flu A&B, Covid) Nasopharyngeal Swab     Status: Abnormal   Collection Time: 06/01/20  3:35 PM   Specimen: Nasopharyngeal Swab; Nasopharyngeal(NP) swabs in vial transport medium  Result Value Ref Range Status   SARS Coronavirus 2 by RT PCR POSITIVE (A) NEGATIVE Final    Comment: RESULT CALLED TO, READ BACK BY AND VERIFIED WITH: (NOTE) SARS-CoV-2 target nucleic acids are DETECTED.  The SARS-CoV-2 RNA is generally detectable in upper respiratory specimens during the acute phase of infection. Positive results are indicative of the presence of the identified virus, but do not rule out bacterial infection or co-infection with other pathogens not detected by the test. Clinical correlation with patient history and other diagnostic information is necessary to determine patient infection status.  The expected result is Negative.  Fact Sheet for Patients: EntrepreneurPulse.com.au  Fact Sheet for Healthcare Providers: IncredibleEmployment.be  This test is not yet approved or cleared by the [place] FDA and  has been authorized for detection and/or diagnosis of SARS-CoV-2 by FDA under an Emergency Use Authorization (EUA).  This  EUA will remain in effect (meaning this test can be used) for the duration of  the [place] VID-19 declaration under Section 564(b)(1) of the Act, 21 U.S.C. section 360bbb-3(b)(1), unless the authorization is terminated or revoked sooner.     Influenza A by PCR NEGATIVE NEGATIVE Final    Comment: A,OLEARY @1735  06/01/20 EB   Influenza B by PCR NEGATIVE NEGATIVE Final    Comment: (NOTE) The Xpert Xpress SARS-CoV-2/FLU/RSV plus assay is intended as an aid in the diagnosis of influenza from Nasopharyngeal swab specimens and should not be used as a sole basis for treatment. Nasal washings and aspirates are unacceptable for Xpert Xpress SARS-CoV-2/FLU/RSV testing.  Fact Sheet for Patients: EntrepreneurPulse.com.au  Fact Sheet for Healthcare Providers: IncredibleEmployment.be  This test is not yet approved or cleared by the [place] FDA and has been authorized for detection and/or diagnosis of SARS-CoV-2 by FDA under an Emergency Use Authorization (EUA). This EUA will remain in effect (meaning this test can be used) for the duration of the COVID-19 declaration under Section 564(b)(1) of the Act, 21 U.S.C. section 360bbb-3(b)(1), unless the authorization is terminated or revoked.  Performed at [facility], [place] [address] [place], [place] [address]   Blood culture (routine x 2)     Status: Abnormal   Collection Time: 06/01/20  4:00 PM   Specimen: BLOOD RIGHT FOREARM  Result Value Ref Range Status   Specimen Description BLOOD RIGHT FOREARM  Final   Special Requests   Final    BOTTLES DRAWN AEROBIC AND ANAEROBIC Blood Culture adequate volume   Culture  Setup Time   Final    GRAM POSITIVE COCCI IN CLUSTERS IN BOTH AEROBIC AND ANAEROBIC BOTTLES Organism ID to follow CRITICAL RESULT CALLED TO, READ BACK BY AND VERIFIED WITHSalli Real, AT 0603 06/02/20 [facility] Performed at [facility], [place] [address] [place], [place]  [address]    Culture STAPHYLOCOCCUS AUREUS (A)  Final   Report Status 06/04/2020 FINAL  Final   Organism ID, Bacteria STAPHYLOCOCCUS AUREUS  Final      Susceptibility   Staphylococcus aureus - MIC*    CIPROFLOXACIN <=0.5 SENSITIVE Sensitive     ERYTHROMYCIN <=0.25 SENSITIVE Sensitive     GENTAMICIN <=0.5 SENSITIVE Sensitive     OXACILLIN <=0.25 SENSITIVE Sensitive     TETRACYCLINE <=1 SENSITIVE Sensitive     VANCOMYCIN 1 SENSITIVE Sensitive     TRIMETH/SULFA <=10 SENSITIVE Sensitive     CLINDAMYCIN <=0.25 SENSITIVE Sensitive     RIFAMPIN <=0.5 SENSITIVE Sensitive     Inducible Clindamycin NEGATIVE Sensitive     * STAPHYLOCOCCUS AUREUS  Blood Culture ID Panel (Reflexed)     Status: Abnormal   Collection Time: 06/01/20  4:00 PM  Result Value Ref Range Status   Enterococcus faecalis NOT DETECTED NOT DETECTED Final   Enterococcus Faecium NOT DETECTED NOT DETECTED Final   Listeria monocytogenes NOT DETECTED NOT DETECTED Final   Staphylococcus species DETECTED (A) NOT DETECTED Final    Comment: CRITICAL RESULT CALLED TO, READ BACK BY AND VERIFIED WITH: Denton Brick PHARMD, AT 0603 06/02/20 D. VANHOOK    Staphylococcus aureus (BCID) DETECTED (A) NOT DETECTED  Final    Comment: CRITICAL RESULT CALLED TO, READ BACK BY AND VERIFIED WITH: G. ABBOTT PHARMD, AT 0603 06/02/20 D. VANHOOK    Staphylococcus epidermidis NOT DETECTED NOT DETECTED Final   Staphylococcus lugdunensis NOT DETECTED NOT DETECTED Final   Streptococcus species NOT DETECTED NOT DETECTED Final   Streptococcus agalactiae NOT DETECTED NOT DETECTED Final   Streptococcus pneumoniae NOT DETECTED NOT DETECTED Final   Streptococcus pyogenes NOT DETECTED NOT DETECTED Final   A.calcoaceticus-baumannii NOT DETECTED NOT DETECTED Final   Bacteroides fragilis NOT DETECTED NOT DETECTED Final   Enterobacterales NOT DETECTED NOT DETECTED Final   Enterobacter cloacae complex NOT DETECTED NOT DETECTED Final   Escherichia coli NOT DETECTED NOT  DETECTED Final   Klebsiella aerogenes NOT DETECTED NOT DETECTED Final   Klebsiella oxytoca NOT DETECTED NOT DETECTED Final   Klebsiella pneumoniae NOT DETECTED NOT DETECTED Final   Proteus species NOT DETECTED NOT DETECTED Final   Salmonella species NOT DETECTED NOT DETECTED Final   Serratia marcescens NOT DETECTED NOT DETECTED Final   Haemophilus influenzae NOT DETECTED NOT DETECTED Final   Neisseria meningitidis NOT DETECTED NOT DETECTED Final   Pseudomonas aeruginosa NOT DETECTED NOT DETECTED Final   Stenotrophomonas maltophilia NOT DETECTED NOT DETECTED Final   Candida albicans NOT DETECTED NOT DETECTED Final   Candida auris NOT DETECTED NOT DETECTED Final   Candida glabrata NOT DETECTED NOT DETECTED Final   Candida krusei NOT DETECTED NOT DETECTED Final   Candida parapsilosis NOT DETECTED NOT DETECTED Final   Candida tropicalis NOT DETECTED NOT DETECTED Final   Cryptococcus neoformans/gattii NOT DETECTED NOT DETECTED Final   Meth resistant mecA/C and MREJ NOT DETECTED NOT DETECTED Final    Comment: Performed at [facility], [address], [place], [place] [address]  Blood culture (routine x 2)     Status: Abnormal   Collection Time: 06/01/20  4:02 PM   Specimen: BLOOD LEFT FOREARM  Result Value Ref Range Status   Specimen Description BLOOD LEFT FOREARM  Final   Special Requests   Final    BOTTLES DRAWN AEROBIC AND ANAEROBIC Blood Culture adequate volume   Culture  Setup Time   Final    GRAM POSITIVE COCCI IN CLUSTERS IN BOTH AEROBIC AND ANAEROBIC BOTTLES    Culture (A)  Final    STAPHYLOCOCCUS AUREUS SUSCEPTIBILITIES PERFORMED ON PREVIOUS CULTURE WITHIN THE LAST 5 DAYS. Performed at [facility], [place] [address] [place], [place] [address]    Report Status 06/04/2020 FINAL  Final  Culture, blood (routine x 2)     Status: None (Preliminary result)   Collection Time: 06/03/20  9:13 AM   Specimen: BLOOD  Result Value Ref Range Status   Specimen Description  BLOOD LEFT ANTECUBITAL  Final   Special Requests   Final    BOTTLES DRAWN AEROBIC AND ANAEROBIC Blood Culture adequate volume   Culture   Final    NO GROWTH 4 DAYS Performed at [facility], [place] 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   He is on cefazolin and plan for 6 weeks through 5/9.    2.  Access - picc line in place.    3. Medication monitoring - home health monitoring of CBC, CMP per protocol.   IV Abx note done 4/1 and OPAT in place.

## 2020-06-07 NOTE — TOC Transition Note (Addendum)
Transition of Care [facility]) - CM/SW Discharge Note   Patient Details  Name: Travis Hampton MRN: [id number] Date of Birth: 09-02-1960  [facility] CM/SW Contact:  Carles Collet, RN Phone Number: 06/07/2020, 10:31 AM   Clinical Narrative:    Notified by Carolynn Sayers RN [facility] that patinet has been set up with [facility] for infusion/ PICC line care and will begin home care tomorrow with 2pm dose at home. Medications are getting delivered to the house today. Teaching has been completed for Infusions. Patient was sent instructional video for IV medication administration at his request to be able to discharge today. Pam states she will call patient this morning to review any questions about video.  She states that patient is clear to DC today after 2pm infusion has been given in the hospital.  CM notified bedside nurse that patient will need 2pm infusion completed prior to hospital DC.     Final next level of care: [place] Barriers to Discharge: No Barriers Identified   Patient Goals and CMS Choice        Discharge Placement                       Discharge Plan and Services                          HH Arranged: RN [facility] Agency: [facility] Date HH Agency Contacted: 06/07/20 Time [place]: 85 Representative spoke with at [place]: [place] Determinants of Health ([place]) Interventions     Readmission Risk Interventions No flowsheet data found.

## 2020-06-07 NOTE — Discharge Summary (Signed)
Travis Hampton XEN:[id number] DOB: 1960-06-17 DOA: 06/01/2020  PCP: Reynold Bowen, MD  Admit date: 06/01/2020  Discharge date: 06/07/2020  Admitted From: Home   Disposition:  Home   Recommendations for Outpatient Follow-up:   Follow up with PCP in 1-2 weeks  PCP Please obtain BMP/CBC, 2 view CXR in 1week,  (see Discharge instructions)   PCP Please follow up on the following pending results: Please review CT scan and echocardiogram report.  Please make sure he follows with ID and cardiology within 7 to 10 days.  Monitor glycemic control closely.   Home Health:  RN Equipment/Devices: None Consultations: ID, Cards Discharge Condition: Stable    CODE STATUS: Full    Diet Recommendation: Heart Healthy Low Carb  Diet Order            Diet heart healthy/carb modified Room service appropriate? Yes; Fluid consistency: Thin  Diet effective now                  Chief Complaint  Patient presents with  . Shortness of Breath     Brief history of present illness from the day of admission and additional interim summary    Travis Rahimi Thompsonis a 60 y.o.malewith medical history significant ofIIDM, HLD,chronic cirrhosis secondary to remote alcohol abuse, presented with increasing cough shortness of breath fever, in the ER he was diagnosed with COVID-19 pneumonia the hospital, further work-up indicated that he also had bacteremia and mitral valve endocarditis                                                                 Hospital Course    1. Acute Hypoxic Resp. Failure due to Acute Covid 19 Viral Pneumonitis during the ongoing 2020 Covid 19 Pandemic - he is partially vaccinated with 2 mRNA shots, he was treated with combination of IV steroids and Remdesivir.  Stable on room air he has finished COVID-19  treatment.    2. MSSA bacteremia.-   ? Source, does have a few scabs on his R.leg.  Repeat blood cultures drawn on 06/03/2020 - thus far transthoracic echocardiogram stable,  however did show mitral valve endocarditis on TEE , plan discussed with ID Dr. Linus Salmons.  Plan is PICC line and 6 weeks of IV cefazolin with outpatient follow-up with cardiology and ID.  3.  Dyslipidemia.  On statin continue.  4.  Elevated lactic acid, hydrate, hold Glucophage.    5.  Hypokalemia.  Replaced.    6. DM 2 -poor outpatient control due to hyperglycemia: Placed on Lantus along with sliding scale, DM and insulin education provided, provided with testing supplies as well PCP to monitor glycemic control post discharge.  7.  Incidental possible cirrhosis on CT scan.  Defer outpatient follow-up by PCP and outpatient work-up as indicated  Discharge diagnosis     Active Problems:   COVID-19 virus infection   COVID    Discharge instructions    Discharge Instructions    [facility] pharmacist to adjust dose for Vancomycin, Aminoglycosides and other anti-infective therapies as requested by physician.   Complete by: As directed    [facility] to provide Cath Flo 37m   Complete by: As directed    Administer for PICC line occlusion and as ordered by physician for other access device issues.   Anaphylaxis Kit: Provided to treat any anaphylactic reaction to the medication being provided to the patient if First Dose or when requested by physician   Complete by: As directed    Epinephrine 132mml vial / amp: Administer 0.4m74m0.4ml31mubcutaneously once for moderate to severe anaphylaxis, nurse to call physician and pharmacy when reaction occurs and call 911 if needed for immediate care   Diphenhydramine 50mg78mIV vial: Administer 25-50mg 72mM PRN for first dose reaction, rash, itching, mild reaction, nurse to call physician and pharmacy when reaction occurs   Sodium Chloride 0.9% NS  500ml I6mdminister if needed for hypovolemic blood pressure drop or as ordered by physician after call to physician with anaphylactic reaction   Change dressing on IV access line weekly and PRN   Complete by: As directed    Discharge instructions   Complete by: As directed    Follow with Primary MD [place], Reynold Bowen 7 days   Get CBC, CMP, 2 view Chest X ray -  checked next visit within 1 week by Primary MD    Activity: As tolerated with Full fall precautions use walker/cane & assistance as needed  Disposition Home    Diet: Heart Healthy Low Carb  Accuchecks 4 times/day, Once in AM empty stomach and then before each meal. Log in all results and show them to your Prim.MD in 3 days. If any glucose reading is under 80 or above 300 call your Prim MD immidiately. Follow Low glucose instructions for glucose under 80 as instructed.   Special Instructions: If you have smoked or chewed Tobacco  in the last 2 yrs please stop smoking, stop any regular Alcohol  and or any Recreational drug use.  On your next visit with your primary care physician please Get Medicines reviewed and adjusted.  Please request your Prim.MD to go over all Hospital Tests and Procedure/Radiological results at the follow up, please get all Hospital records sent to your Prim MD by signing hospital release before you go home.  If you experience worsening of your admission symptoms, develop shortness of breath, life threatening emergency, suicidal or homicidal thoughts you must seek medical attention immediately by calling 911 or calling your MD immediately  if symptoms less severe.  You Must read complete instructions/literature along with all the possible adverse reactions/side effects for all the Medicines you take and that have been prescribed to you. Take any new Medicines after you have completely understood and accpet all the possible adverse reactions/side effects.   Flush IV access with Sodium Chloride 0.9% and  Heparin 10 units/ml or 100 units/ml   Complete by: As directed    Home infusion instructions - [facility]   Complete by: As directed    Instructions: Flush IV access with Sodium Chloride 0.9% and Heparin 10units/ml or 100units/ml   Change dressing on IV access line: Weekly and PRN   Instructions Cath Flo 2mg: Ad67mister for PICC Line occlusion and as ordered by physician  for other access device   [facility] pharmacist to adjust dose for: Vancomycin, Aminoglycosides and other anti-infective therapies as requested by physician   Increase activity slowly   Complete by: As directed    Method of administration may be changed at the discretion of home infusion pharmacist based upon assessment of the patient and/or caregiver's ability to self-administer the medication ordered   Complete by: As directed    No wound care   Complete by: As directed       Discharge Medications   Allergies as of 06/07/2020   No Known Allergies     Medication List    STOP taking these medications   metFORMIN 500 MG tablet Commonly known as: GLUCOPHAGE     TAKE these medications   atorvastatin 20 MG tablet Commonly known as: LIPITOR Take 20 mg by mouth daily.   blood glucose meter kit and supplies Kit Dispense based on patient and insurance preference. Use up to four times daily as directed. (FOR ICD-9 250.00, 250.01). For QAC - HS accuchecks.   ceFAZolin  IVPB Commonly known as: ANCEF Inject 2 g into the vein every 8 (eight) hours. Indication:  Endocarditis  First Dose: No Last Day of Therapy:  07/14/2020 Labs - Once weekly:  CBC/D and BMP, Labs - Every other week:  ESR and CRP Method of administration: IV Push Method of administration may be changed at the discretion of home infusion pharmacist based upon assessment of the patient and/or caregiver's ability to self-administer the medication ordered.   ibuprofen 200 MG tablet Commonly known as: ADVIL Take 400 mg by mouth every  6 (six) hours as needed for mild pain.   insulin aspart 100 UNIT/ML injection Commonly known as: NovoLOG Substitute to any brand approved.Before each meal 3 times a day, 140-199 - 2 units, 200-250 - 4 units, 251-299 - 6 units,  300-349 - 8 units,  350 or above 10 units. Dispense syringes and needles as needed, Ok to switch to PEN if approved. DX DM2, Code E11.65   insulin glargine 100 UNIT/ML injection Commonly known as: Lantus Inject 0.15 mLs (15 Units total) into the skin at bedtime. Dispense insulin pen if approved, if not dispense as needed syringes and needles for 1 month supply. Can switch to Levemir. Diagnosis E 11.65.   Insulin Syringe-Needle U-100 25G X 1" 1 ML Misc For 4 times a day insulin SQ, 1 month supply. Diagnosis E11.65   multivitamin tablet Take 1 tablet by mouth daily. Centrum MVI-Take one daily            Discharge Care Instructions  (From admission, onward)         Start     Ordered   06/07/20 0000  Change dressing on IV access line weekly and PRN  (Home infusion instructions - [facility] )        06/07/20 0935           Follow-up Information    Reynold Bowen, MD. Schedule an appointment as soon as possible for a visit in 1 week(s).   Specialty: Endocrinology Contact information: [place] [address] [phone]        Thayer Headings, MD. Schedule an appointment as soon as possible for a visit in 1 week(s).   Specialty: Infectious Diseases Why: Endocarditis Contact information: [address] [place] [address] [phone]        Adrian Prows, MD Follow up in 2 week(s).   Specialty:  Cardiology Why: Endocarditis Contact information: [place] [address] [phone]               Major procedures and Radiology Reports - PLEASE review detailed and final reports thoroughly  -        DG Chest 2 View  Result Date: 06/01/2020 CLINICAL DATA:  Shortness of  breath EXAM: CHEST - 2 VIEW COMPARISON:  06/01/2020 prior studies FINDINGS: The cardiomediastinal silhouette is unremarkable. Minimal pulmonary vascular congestion present. There is no evidence of focal airspace disease, pulmonary edema, suspicious pulmonary nodule/mass, pleural effusion, or pneumothorax. No acute bony abnormalities are identified. IMPRESSION: Minimal pulmonary vascular congestion. Electronically Signed   By: Margarette Canada M.D.   On: 06/01/2020 14:59   DG Chest 2 View  Result Date: 06/01/2020 CLINICAL DATA:  Cough and shortness of breath EXAM: CHEST - 2 VIEW COMPARISON:  05/29/20 FINDINGS: The heart size and mediastinal contours are within normal limits. Both lungs are clear. Increased pulmonary vascular congestion without overt edema. No pleural effusion. The visualized skeletal structures are unremarkable. IMPRESSION: Increased pulmonary vascular congestion without overt edema. Electronically Signed   By: Kerby Moors M.D.   On: 06/01/2020 13:12   DG Chest 2 View  Result Date: 05/29/2020 CLINICAL DATA:  Chest pain and shortness of breath beginning yesterday. Intermittent hemoptysis. EXAM: CHEST - 2 VIEW COMPARISON:  01/04/2015 FINDINGS: The heart size and mediastinal contours are within normal limits. Both lungs are clear. The visualized skeletal structures are unremarkable. IMPRESSION: No active cardiopulmonary disease. Electronically Signed   By: Marlaine Hind M.D.   On: 05/29/2020 13:48   CT Angio Chest PE W/Cm &/Or Wo Cm  Result Date: 05/29/2020 CLINICAL DATA:  Chest pain. EXAM: CT ANGIOGRAPHY CHEST WITH CONTRAST TECHNIQUE: Multidetector CT imaging of the chest was performed using the standard protocol during bolus administration of intravenous contrast. Multiplanar CT image reconstructions and MIPs were obtained to evaluate the vascular anatomy. CONTRAST:  130m OMNIPAQUE IOHEXOL 350 MG/ML SOLN COMPARISON:  January 04, 2015. FINDINGS: Cardiovascular: Satisfactory opacification of  the pulmonary arteries to the segmental level. No evidence of pulmonary embolism. Normal heart size. No pericardial effusion. Mediastinum/Nodes: No enlarged mediastinal, hilar, or axillary lymph nodes. Thyroid gland, trachea, and esophagus demonstrate no significant findings. Lungs/Pleura: No pneumothorax or pleural effusion is noted. 5 mm nodule is noted in left lower lobe best seen on image number 53 of series 5. 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 Mild splenomegaly is noted. Musculoskeletal: No chest wall abnormality. No acute or significant osseous findings. Review of the MIP images confirms the above findings. IMPRESSION: 1. No definite evidence of pulmonary embolus. 2. 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 Mild splenomegaly is noted. 3. 5 mm left lower lobe nodule is noted. No follow-up needed if patient is low-risk. Non-contrast chest CT can be considered in 12 months if patient is high-risk. This recommendation follows the consensus statement: Guidelines for Management of Incidental Pulmonary Nodules Detected on CT Images: From the Fleischner Society 2017; Radiology 2017; 284:228-243. Electronically Signed   By: JMarijo ConceptionM.D.   On: 05/29/2020 15:52   [place] Img Lower Unilateral Left  Result Date: 05/29/2020 CLINICAL DATA:  Left leg pain EXAM: LEFT LOWER EXTREMITY VENOUS DOPPLER ULTRASOUND TECHNIQUE: Gray-scale sonography with graded compression, as well as color Doppler and duplex ultrasound were performed to evaluate the lower extremity deep venous systems from the level of the common femoral vein and including the common femoral, femoral, profunda femoral,  popliteal and calf veins including the posterior tibial, peroneal and gastrocnemius veins when visible. The superficial great saphenous vein was also interrogated. Spectral Doppler was utilized to evaluate flow at rest and with distal augmentation maneuvers in the common femoral, femoral and popliteal veins. COMPARISON:  None.  FINDINGS: Contralateral Common Femoral Vein: Respiratory phasicity is normal and symmetric with the symptomatic side. No evidence of thrombus. Normal compressibility. Common Femoral Vein: No evidence of thrombus. Normal compressibility, respiratory phasicity and response to augmentation. Saphenofemoral Junction: No evidence of thrombus. Normal compressibility and flow on color Doppler imaging. Profunda Femoral Vein: No evidence of thrombus. Normal compressibility and flow on color Doppler imaging. Femoral Vein: No evidence of thrombus. Normal compressibility, respiratory phasicity and response to augmentation. Popliteal Vein: No evidence of thrombus. Normal compressibility, respiratory phasicity and response to augmentation. Calf Veins: No evidence of thrombus. Normal compressibility and flow on color Doppler imaging. Superficial Great Saphenous Vein: No evidence of thrombus. Normal compressibility. Venous Reflux:  None. Other Findings:  None. IMPRESSION: No evidence of deep venous thrombosis. Electronically Signed   By: Inez Catalina M.D.   On: 05/29/2020 15:57   DG Chest Port 1 View  Result Date: 06/02/2020 CLINICAL DATA:  60 year old male with shortness of breath, history of COVID 19. EXAM: PORTABLE CHEST - 1 VIEW COMPARISON:  06/01/2020 FINDINGS: The mediastinal contours are within normal limits. No cardiomegaly. The lungs are clear bilaterally without evidence of focal consolidation, pleural effusion, or pneumothorax. No acute osseous abnormality. IMPRESSION: No acute cardiopulmonary process. Electronically Signed   By: Ruthann Cancer MD   On: 06/02/2020 08:47   ECHOCARDIOGRAM COMPLETE  Result Date: 06/03/2020    ECHOCARDIOGRAM REPORT   Patient Name:   Travis Hampton Date of Exam: 06/03/2020 Medical Rec #:  [id number]        Height:       74.0 in Accession #:    [id number]       Weight:       261.4 lb Date of Birth:  1960/05/28        BSA:          2.435 m Patient Age:    21 years         BP:            118/62 mmHg Patient Gender: M                HR:           99 bpm. Exam Location:  Inpatient Procedure: 2D Echo Indications:    Bacteremia R78.81  History:        Patient has no prior history of Echocardiogram examinations.                 Risk Factors:Diabetes, Dyslipidemia and Former Smoker. COVID 19,                 Staph aureus bactermia.  Sonographer:    Leavy Cella Referring Phys: [place]  1. Left ventricular ejection fraction, by estimation, is 60 to 65%. The left ventricle has normal function. The left ventricle has no regional wall motion abnormalities. Left ventricular diastolic function could not be evaluated.  2. Right ventricular systolic function is normal. The right ventricular size is normal.  3. The mitral valve is normal in structure. No evidence of mitral valve regurgitation. No evidence of mitral stenosis.  4. The aortic valve is tricuspid. Aortic valve regurgitation is not visualized. No aortic stenosis is present.  FINDINGS  Left Ventricle: Left ventricular ejection fraction, by estimation, is 60 to 65%. The left ventricle has normal function. The left ventricle has no regional wall motion abnormalities. The left ventricular internal cavity size was normal in size. There is  no left ventricular hypertrophy. Left ventricular diastolic function could not be evaluated. Right Ventricle: The right ventricular size is normal. No increase in right ventricular wall thickness. Right ventricular systolic function is normal. Left Atrium: Left atrial size was normal in size. Right Atrium: Right atrial size was normal in size. Pericardium: There is no evidence of pericardial effusion. Mitral Valve: The mitral valve is normal in structure. Mild mitral annular calcification. No evidence of mitral valve regurgitation. No evidence of mitral valve stenosis. Tricuspid Valve: The tricuspid valve is normal in structure. Tricuspid valve regurgitation is not demonstrated. No evidence of  tricuspid stenosis. Aortic Valve: The aortic valve is tricuspid. Aortic valve regurgitation is not visualized. No aortic stenosis is present. Pulmonic Valve: The pulmonic valve was normal in structure. Pulmonic valve regurgitation is not visualized. No evidence of pulmonic stenosis. Aorta: The aortic root is normal in size and structure. Venous: The inferior vena cava was not well visualized. IAS/Shunts: No atrial level shunt detected by color flow Doppler. Skeet Latch MD Electronically signed by Skeet Latch MD Signature Date/Time: 06/03/2020/6:27:37 PM    Final    ECHO TEE  Result Date: 06/06/2020    TRANSESOPHOGEAL ECHO REPORT   Patient Name:   Travis Hampton Date of Exam: 06/06/2020 Medical Rec #:  [id number]        Height:       74.0 in Accession #:    [id number]       Weight:       261.4 lb Date of Birth:  [birth year]        BSA:          2.435 m Patient Age:    17 years         BP:           156/124 mmHg Patient Gender: M                HR:           99 bpm. Exam Location:  Inpatient Procedure: 3D Echo, Transesophageal Echo, Cardiac Doppler and Color Doppler Indications:     Bacteremia  History:         Patient has prior history of Echocardiogram examinations, most                  recent 06/03/2020.  Sonographer:     Philipp Deputy Referring Phys:  [id number] Tami Lin [facility] Diagnosing Phys: Dorris Carnes MD PROCEDURE: After discussion of the risks and benefits of a TEE, an informed consent was obtained from the patient. The transesophogeal probe was passed without difficulty through the esophogus of the patient. Local oropharyngeal anesthetic was provided with viscous lidocaine. Sedation performed by different physician. The patient was monitored while under deep sedation. Anesthestetic sedation was provided intravenously by Anesthesiology: 240m of Propofol. Image quality was adequate. The patient developed no complications during the procedure. IMPRESSIONS  1. The left ventricle has normal  function. The left ventricle has no regional wall motion abnormalities.  2. Right ventricular systolic function is normal. The right ventricular size is normal.  3. No left atrial/left atrial appendage thrombus was detected.  4. The mitral valve is thickened. There is a mass on the ventricular surface of the posterior mitral leaflet, composed  of multiple echodensities. Measures 8 x 15 mm Tethered in chordae Consistent with vegetation.. Mild mitral valve regurgitation.  5. AV is mildly thickened, sclerotic. Small echodensity consistent with Lambl's excrescense; cannot exclude tiny vegetation. The aortic valve is tricuspid. Aortic valve regurgitation is mild. FINDINGS  Left Ventricle: The left ventricle has normal function. The left ventricle has no regional wall motion abnormalities. The left ventricular internal cavity size was normal in size. Right Ventricle: The right ventricular size is normal. Right vetricular wall thickness was not assessed. Right ventricular systolic function is normal. Left Atrium: Left atrial size was normal in size. No left atrial/left atrial appendage thrombus was detected. Right Atrium: Right atrial size was normal in size. Pericardium: There is no evidence of pericardial effusion. Mitral Valve: The mitral valve is thickened. There is a mass on the ventricular surface of the posterior mitral leaflet, composed of multiple echodensities. Measures 8 x 15 mm Tethered in chordae Consistent with vegetation. The mitral valve is abnormal. Mild mitral valve regurgitation. Tricuspid Valve: The tricuspid valve is normal in structure. Tricuspid valve regurgitation is trivial. Aortic Valve: AV is mildly thickened, sclerotic. Small echodensity consistent with Lambl's excrescense; cannot exclude tiny vegetation. Mild AI. The aortic valve is tricuspid. Aortic valve regurgitation is mild. Pulmonic Valve: The pulmonic valve was normal in structure. Pulmonic valve regurgitation is not visualized. Aorta: The  aortic root is normal in size and structure. There is minimal (Grade I) plaque. IAS/Shunts: No atrial level shunt detected by color flow Doppler. Dorris Carnes MD Electronically signed by Dorris Carnes MD Signature Date/Time: 06/06/2020/3:34:23 PM    Final    [place] EKG SITE RITE  Result Date: 06/06/2020 If Site Rite image not attached, placement could not be confirmed due to current cardiac rhythm.  [place] EKG SITE RITE  Result Date: 06/05/2020 If Site Rite image not attached, placement could not be confirmed due to current cardiac rhythm.   Micro Results     Recent Results (from the past 240 hour(s))  Covid-19, Flu A+B ([facility])     Status: None   Collection Time: 06/01/20 12:54 PM   Specimen: Nasopharyngeal   Naso  Result Value Ref Range Status   SARS-CoV-2, NAA Not Detected Not Detected Final    Comment: This nucleic acid amplification test was developed and its performance characteristics determined by [employer]. Nucleic acid amplification tests include RT-PCR and TMA. This test has not been FDA cleared or approved. This test has been authorized by FDA under an Emergency Use Authorization (EUA). This test is only authorized for the duration of time the declaration that circumstances exist justifying the authorization of the emergency use of in vitro diagnostic tests for detection of SARS-CoV-2 virus and/or diagnosis of COVID-19 infection under section 564(b)(1) of the Act, 21 U.S.C. 967ELF-8(B) (1), unless the authorization is terminated or revoked sooner. When diagnostic testing is negative, the possibility of a false negative result should be considered in the context of a patient's recent exposures and the presence of clinical signs and symptoms consistent with COVID-19. An individual without symptoms of COVID-19 and who is not shedding SARS-CoV-2 virus wo uld expect to have a negative (not detected) result in this assay.    Influenza A, NAA Not Detected Not Detected Final    Influenza B, NAA Not Detected Not Detected Final  Resp Panel by RT-PCR (Flu A&B, Covid) Nasopharyngeal Swab     Status: Abnormal   Collection Time: 06/01/20  3:35 PM   Specimen: Nasopharyngeal Swab; Nasopharyngeal(NP) swabs in  vial transport medium  Result Value Ref Range Status   SARS Coronavirus 2 by RT PCR POSITIVE (A) NEGATIVE Final    Comment: RESULT CALLED TO, READ BACK BY AND VERIFIED WITH: (NOTE) SARS-CoV-2 target nucleic acids are DETECTED.  The SARS-CoV-2 RNA is generally detectable in upper respiratory specimens during the acute phase of infection. Positive results are indicative of the presence of the identified virus, but do not rule out bacterial infection or co-infection with other pathogens not detected by the test. Clinical correlation with patient history and other diagnostic information is necessary to determine patient infection status. The expected result is Negative.  Fact Sheet for Patients: EntrepreneurPulse.com.au  Fact Sheet for Healthcare Providers: IncredibleEmployment.be  This test is not yet approved or cleared by the [place] FDA and  has been authorized for detection and/or diagnosis of SARS-CoV-2 by FDA under an Emergency Use Authorization (EUA).  This EUA will remain in effect (meaning this test can be used) for the duration of  the [place] VID-19 declaration under Section 564(b)(1) of the Act, 21 U.S.C. section 360bbb-3(b)(1), unless the authorization is terminated or revoked sooner.     Influenza A by PCR NEGATIVE NEGATIVE Final    Comment: A,OLEARY @1735  06/01/20 EB   Influenza B by PCR NEGATIVE NEGATIVE Final    Comment: (NOTE) The Xpert Xpress SARS-CoV-2/FLU/RSV plus assay is intended as an aid in the diagnosis of influenza from Nasopharyngeal swab specimens and should not be used as a sole basis for treatment. Nasal washings and aspirates are unacceptable for Xpert Xpress  SARS-CoV-2/FLU/RSV testing.  Fact Sheet for Patients: EntrepreneurPulse.com.au  Fact Sheet for Healthcare Providers: IncredibleEmployment.be  This test is not yet approved or cleared by the [place] FDA and has been authorized for detection and/or diagnosis of SARS-CoV-2 by FDA under an Emergency Use Authorization (EUA). This EUA will remain in effect (meaning this test can be used) for the duration of the COVID-19 declaration under Section 564(b)(1) of the Act, 21 U.S.C. section 360bbb-3(b)(1), unless the authorization is terminated or revoked.  Performed at [facility], [place] [address], [place], [place] [address]   Blood culture (routine x 2)     Status: Abnormal   Collection Time: 06/01/20  4:00 PM   Specimen: BLOOD RIGHT FOREARM  Result Value Ref Range Status   Specimen Description BLOOD RIGHT FOREARM  Final   Special Requests   Final    BOTTLES DRAWN AEROBIC AND ANAEROBIC Blood Culture adequate volume   Culture  Setup Time   Final    GRAM POSITIVE COCCI IN CLUSTERS IN BOTH AEROBIC AND ANAEROBIC BOTTLES Organism ID to follow CRITICAL RESULT CALLED TO, READ BACK BY AND VERIFIED WITHSalli Real, AT 0603 06/02/20 [facility] Performed at [facility], [place] [address], [place], [place] [address]    Culture STAPHYLOCOCCUS AUREUS (A)  Final   Report Status 06/04/2020 FINAL  Final   Organism ID, Bacteria STAPHYLOCOCCUS AUREUS  Final      Susceptibility   Staphylococcus aureus - MIC*    CIPROFLOXACIN <=0.5 SENSITIVE Sensitive     ERYTHROMYCIN <=0.25 SENSITIVE Sensitive     GENTAMICIN <=0.5 SENSITIVE Sensitive     OXACILLIN <=0.25 SENSITIVE Sensitive     TETRACYCLINE <=1 SENSITIVE Sensitive     VANCOMYCIN 1 SENSITIVE Sensitive     TRIMETH/SULFA <=10 SENSITIVE Sensitive     CLINDAMYCIN <=0.25 SENSITIVE Sensitive     RIFAMPIN <=0.5 SENSITIVE Sensitive     Inducible Clindamycin NEGATIVE Sensitive     *  STAPHYLOCOCCUS  AUREUS  Blood Culture ID Panel (Reflexed)     Status: Abnormal   Collection Time: 06/01/20  4:00 PM  Result Value Ref Range Status   Enterococcus faecalis NOT DETECTED NOT DETECTED Final   Enterococcus Faecium NOT DETECTED NOT DETECTED Final   Listeria monocytogenes NOT DETECTED NOT DETECTED Final   Staphylococcus species DETECTED (A) NOT DETECTED Final    Comment: CRITICAL RESULT CALLED TO, READ BACK BY AND VERIFIED WITH: G. ABBOTT PHARMD, AT 0603 06/02/20 D. VANHOOK    Staphylococcus aureus (BCID) DETECTED (A) NOT DETECTED Final    Comment: CRITICAL RESULT CALLED TO, READ BACK BY AND VERIFIED WITH: G. ABBOTT PHARMD, AT 0603 06/02/20 D. VANHOOK    Staphylococcus epidermidis NOT DETECTED NOT DETECTED Final   Staphylococcus lugdunensis NOT DETECTED NOT DETECTED Final   Streptococcus species NOT DETECTED NOT DETECTED Final   Streptococcus agalactiae NOT DETECTED NOT DETECTED Final   Streptococcus pneumoniae NOT DETECTED NOT DETECTED Final   Streptococcus pyogenes NOT DETECTED NOT DETECTED Final   A.calcoaceticus-baumannii NOT DETECTED NOT DETECTED Final   Bacteroides fragilis NOT DETECTED NOT DETECTED Final   Enterobacterales NOT DETECTED NOT DETECTED Final   Enterobacter cloacae complex NOT DETECTED NOT DETECTED Final   Escherichia coli NOT DETECTED NOT DETECTED Final   Klebsiella aerogenes NOT DETECTED NOT DETECTED Final   Klebsiella oxytoca NOT DETECTED NOT DETECTED Final   Klebsiella pneumoniae NOT DETECTED NOT DETECTED Final   Proteus species NOT DETECTED NOT DETECTED Final   Salmonella species NOT DETECTED NOT DETECTED Final   Serratia marcescens NOT DETECTED NOT DETECTED Final   Haemophilus influenzae NOT DETECTED NOT DETECTED Final   Neisseria meningitidis NOT DETECTED NOT DETECTED Final   Pseudomonas aeruginosa NOT DETECTED NOT DETECTED Final   Stenotrophomonas maltophilia NOT DETECTED NOT DETECTED Final   Candida albicans NOT DETECTED NOT DETECTED Final   Candida auris NOT  DETECTED NOT DETECTED Final   Candida glabrata NOT DETECTED NOT DETECTED Final   Candida krusei NOT DETECTED NOT DETECTED Final   Candida parapsilosis NOT DETECTED NOT DETECTED Final   Candida tropicalis NOT DETECTED NOT DETECTED Final   Cryptococcus neoformans/gattii NOT DETECTED NOT DETECTED Final   Meth resistant mecA/C and MREJ NOT DETECTED NOT DETECTED Final    Comment: Performed at [facility], [address] [place], [place] [address]  Blood culture (routine x 2)     Status: Abnormal   Collection Time: 06/01/20  4:02 PM   Specimen: BLOOD LEFT FOREARM  Result Value Ref Range Status   Specimen Description BLOOD LEFT FOREARM  Final   Special Requests   Final    BOTTLES DRAWN AEROBIC AND ANAEROBIC Blood Culture adequate volume   Culture  Setup Time   Final    GRAM POSITIVE COCCI IN CLUSTERS IN BOTH AEROBIC AND ANAEROBIC BOTTLES    Culture (A)  Final    STAPHYLOCOCCUS AUREUS SUSCEPTIBILITIES PERFORMED ON PREVIOUS CULTURE WITHIN THE LAST 5 DAYS. Performed at [facility], [place] [address], [place], [place] [address]    Report Status 06/04/2020 FINAL  Final  Culture, blood (routine x 2)     Status: None (Preliminary result)   Collection Time: 06/03/20  9:13 AM   Specimen: BLOOD  Result Value Ref Range Status   Specimen Description BLOOD LEFT ANTECUBITAL  Final   Special Requests   Final    BOTTLES DRAWN AEROBIC AND ANAEROBIC Blood Culture adequate volume   Culture   Final    NO GROWTH  3 DAYS Performed at [facility], [place] [address], [place], [place] [address]    Report Status PENDING  Incomplete  Culture, blood (routine x 2)     Status: None (Preliminary result)   Collection Time: 06/03/20  9:14 AM   Specimen: BLOOD  Result Value Ref Range Status   Specimen Description BLOOD BLOOD LEFT HAND  Final   Special Requests   Final    BOTTLES DRAWN AEROBIC AND ANAEROBIC Blood Culture adequate volume   Culture   Final    NO GROWTH 3 DAYS Performed at [facility], [place] [address] [place], [place] [address]    Report Status PENDING  Incomplete    Today   Subjective    Travis Hampton today has no headache,no chest abdominal pain,no new weakness tingling or numbness, feels much better wants to go home today.     Objective   Blood pressure (!) 108/58, pulse 66, temperature 97.8 F (36.6 C), temperature source Oral, resp. rate 16, height 6' 2"  (1.88 m), weight 118.6 kg, SpO2 94 %.   Intake/Output Summary (Last 24 hours) at 06/07/2020 0942 Last data filed at 06/07/2020 0612 Gross per 24 hour  Intake 950 ml  Output 1275 ml  Net -325 ml    Exam  Awake Alert, No new F.N deficits, Normal affect [place].AT,PERRAL Supple Neck,No JVD, No cervical lymphadenopathy appriciated.  Symmetrical Chest wall movement, Good air movement bilaterally, CTAB RRR,No Gallops,Rubs or new Murmurs, No Parasternal Heave +ve B.Sounds, Abd Soft, Non tender, No organomegaly appriciated, No rebound -guarding or rigidity. No Cyanosis, Clubbing or edema, No new Rash or bruise   Data Review   CBC w Diff:  Lab Results  Component Value Date   WBC 6.5 06/06/2020   HGB 11.4 (L) 06/06/2020   HCT 34.0 (L) 06/06/2020   PLT 142 (L) 06/06/2020   LYMPHOPCT 24 06/06/2020   MONOPCT 7 06/06/2020   EOSPCT 2 06/06/2020   BASOPCT 0 06/06/2020    CMP:  Lab Results  Component Value Date   NA 134 (L) 06/07/2020   K 3.7 06/07/2020   CL 97 (L) 06/07/2020   CO2 29 06/07/2020   BUN 14 06/07/2020   CREATININE 0.72 06/07/2020   PROT 5.5 (L) 06/06/2020   ALBUMIN 2.2 (L) 06/06/2020   BILITOT 1.0 06/06/2020   ALKPHOS 82 06/06/2020   AST 68 (H) 06/06/2020   ALT 43 06/06/2020  .  Lab Results  Component Value Date   HGBA1C 10.9 (H) 06/02/2020    Total Time in preparing paper work, data evaluation and todays exam - 62 minutes  Lala Lund M.D on 06/07/2020 at 9:42 AM  [facility]

## 2020-06-08 ENCOUNTER — Encounter (HOSPITAL_COMMUNITY): Payer: Self-pay | Admitting: Internal Medicine

## 2020-06-08 LAB — CULTURE, BLOOD (ROUTINE X 2)
Culture: NO GROWTH
Culture: NO GROWTH
Special Requests: ADEQUATE
Special Requests: ADEQUATE

## 2020-06-17 ENCOUNTER — Encounter: Payer: Self-pay | Admitting: Internal Medicine

## 2020-06-17 ENCOUNTER — Telehealth (HOSPITAL_COMMUNITY): Payer: Self-pay | Admitting: Physician Assistant

## 2020-06-19 ENCOUNTER — Other Ambulatory Visit: Payer: Self-pay

## 2020-06-19 ENCOUNTER — Encounter: Payer: Self-pay | Admitting: Internal Medicine

## 2020-06-19 ENCOUNTER — Ambulatory Visit: Payer: 59 | Admitting: Internal Medicine

## 2020-06-19 DIAGNOSIS — E119 Type 2 diabetes mellitus without complications: Secondary | ICD-10-CM

## 2020-06-19 DIAGNOSIS — I059 Rheumatic mitral valve disease, unspecified: Secondary | ICD-10-CM | POA: Diagnosis not present

## 2020-06-19 DIAGNOSIS — U071 COVID-19: Secondary | ICD-10-CM

## 2020-06-19 DIAGNOSIS — Z794 Long term (current) use of insulin: Secondary | ICD-10-CM

## 2020-06-19 NOTE — Progress Notes (Signed)
   Subjective:    Patient ID: Travis Hampton, male    DOB: 07-20-1960, 60 y.o.   MRN: [id number]  HPI He is here for [facility] He was hospitalized last month with what appeared to be symptomatic COVID-19 infection with SOB but blood cultures were positive for Staph aureus and TEE noted a mitral valve endocarditis.  Also found was a new diagnosis of diabetes.  He was started on cefazolin and he continues on this for a projected 6 weeks through Jul 14, 2020.  He is tolerating the antibiotics well with no concerns.  No associated rash or diarrhea.     Review of Systems  Constitutional: Negative for chills and fever.  Gastrointestinal: Negative for diarrhea and nausea.  Skin: Negative for rash.       Objective:   Physical Exam Constitutional:      Appearance: Normal appearance.  Eyes:     General: No scleral icterus. Cardiovascular:     Rate and Rhythm: Normal rate and regular rhythm.     Heart sounds: No murmur heard.   Pulmonary:     Effort: Pulmonary effort is normal.  Neurological:     Mental Status: He is alert.  Psychiatric:        Mood and Affect: Mood normal.   SH: remote tobacco use        Assessment & Plan:

## 2020-06-19 NOTE — Assessment & Plan Note (Signed)
New diagnosis for him during the hospitalization and doing well with better food choices and portions.  Has lost some purposeful weight.

## 2020-06-19 NOTE — Assessment & Plan Note (Signed)
He is doing well with treatment and not having any concerning signs including no fever, no chills.  Will plan to continue with cefazolin through May 9th and he will follow up with me again just prior to that to be sure he is still doing well.

## 2020-06-19 NOTE — Assessment & Plan Note (Signed)
This was more of an incidental finding as his CXR was negative for pneumonia.

## 2020-06-22 NOTE — Progress Notes (Signed)
Primary Physician/Referring:  Reynold Bowen, MD  Patient ID: Travis Hampton, male    DOB: 04-12-1960, 60 y.o.   MRN: [id number]  Chief Complaint  Patient presents with  . Endocarditis of mitral valve  . Shortness of Breath  . Hospitalization Follow-up  . New Patient (Initial Visit)   HPI:    Travis Hampton  is a 60 y.o. Caucasian male with history of hyperlipidemia and 82-pack-years, quit in 2017.  Patient also has new diagnosis of type 2 diabetes mellitus, diagnosed 05/2020.  Patient denies history of MI, TIA/CVA, DVT/PE, heart failure, family history of premature CAD.  Patient has had no previous cardiac evaluation, with the exception of recent inpatient echocardiograms.  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  Notably vegetation was not seen on transthoracic echocardiogram during hospitalization.  Patient was discharged with PICC line in plan 6 weeks of IV cefazolin.  Following closely with infectious disease for management of antibiotics for endocarditis.  Patient has no open wounds at this time.   Patient presently denies chest pain, palpitations, dyspnea, leg swelling, orthopnea, PND.  He denies fever, chills.   Since hospitalization patient has made significant changes to his diet and has slowly been increasing his physical activity.   Past Medical History:  Diagnosis Date  . Arthritis   . Diabetes mellitus without complication ([place])   . High cholesterol   . Numbness    left side face due to tree limb accident, after accident October 2016  . Shoulder pain, left    left shoulder pain  . Torn ligament    left shoulder- found out September 05, 2015   Past Surgical History:  Procedure Laterality Date  . EYE SURGERY     limb fell on cherry picker and pt was thrown out/12/2014  . LEG SURGERY     due to injury from cherry picker  in 01/04/15  . TEE WITHOUT CARDIOVERSION N/A 06/06/2020   Procedure: TRANSESOPHAGEAL ECHOCARDIOGRAM (TEE);  Surgeon: Fay Records, MD;  Location: [facility] ENDOSCOPY;  Service: Cardiovascular;  Laterality: N/A;   Family History  Problem Relation Age of Onset  . Colon cancer Cousin   . Esophageal cancer Neg Hx   . Rectal cancer Neg Hx   . Stomach cancer Neg Hx     Social History   Tobacco Use  . Smoking status: Former Smoker    Packs/day: 0.50    Years: 41.00    Pack years: 20.50    Types: Cigarettes    Quit date: 2017    Years since quitting: 5.2  . Smokeless tobacco: Former Systems developer    Quit date: 09/29/1972  . Tobacco comment: quit July 2017  Substance Use Topics  . Alcohol use: No   Marital Status: Married   ROS  Review of Systems  Constitutional: Negative for malaise/fatigue and weight gain.  Cardiovascular: Negative for chest pain, claudication, leg swelling, near-syncope, orthopnea, palpitations, paroxysmal nocturnal dyspnea and syncope.  Respiratory: Negative for shortness of breath.   Hematologic/Lymphatic: Does not bruise/bleed easily.  Gastrointestinal: Negative for melena.  Neurological: Negative for dizziness and weakness.    Objective  Blood pressure 126/74, pulse (!) 101, temperature 98.8 F (37.1 C), resp. rate 16, height 6' 2"  (1.88 m), weight 255 lb (115.7 kg), SpO2 99 %.  Vitals with BMI 06/23/2020 06/19/2020 06/07/2020  Height 6' 2"  6' 2"  -  Weight 255 lbs  249 lbs -  BMI 83.25 49.82 -  Systolic 641 583 094  Diastolic 74 72 61  Pulse 076 93 84  Some encounter information is confidential and restricted. Go to Review Flowsheets activity to see all data.      Physical Exam Vitals reviewed.  HENT:     Head: Normocephalic and atraumatic.  Cardiovascular:     Rate and Rhythm: Normal rate and regular rhythm.     Pulses: Intact distal pulses.     Heart sounds: S1 normal and S2 normal. Murmur heard.   Blowing holosystolic murmur is present with a grade of 2/6 at the  apex. No gallop.   Pulmonary:     Effort: Pulmonary effort is normal. No respiratory distress.     Breath sounds: No wheezing, rhonchi or rales.  Musculoskeletal:     Right lower leg: No edema.     Left lower leg: No edema.  Skin:    Comments: Wounds on bilateral anterior shins well-healed.  Neurological:     Mental Status: He is alert.     Laboratory examination:   Recent Labs    06/05/20 0119 06/06/20 0618 06/07/20 0756  NA 137 141 134*  K 3.4* 3.4* 3.7  CL 102 103 97*  CO2 31 32 29  GLUCOSE 141* 105* 214*  BUN 20 15 14   CREATININE 0.83 0.70 0.72  CALCIUM 8.1* 8.1* 8.1*  GFRNONAA >60 >60 >60   estimated creatinine clearance is 131.1 mL/min (by C-G formula based on SCr of 0.72 mg/dL).  CMP Latest Ref Rng & Units 06/07/2020 06/06/2020 06/05/2020  Glucose 70 - 99 mg/dL 214(H) 105(H) 141(H)  BUN 8 - 23 mg/dL 14 15 20   Creatinine 0.61 - 1.24 mg/dL 0.72 0.70 0.83  Sodium 135 - 145 mmol/L 134(L) 141 137  Potassium 3.5 - 5.1 mmol/L 3.7 3.4(L) 3.4(L)  Chloride 98 - 111 mmol/L 97(L) 103 102  CO2 22 - 32 mmol/L 29 32 31  Calcium 8.9 - 10.3 mg/dL 8.1(L) 8.1(L) 8.1(L)  Total Protein 6.5 - 8.1 g/dL - 5.5(L) 5.6(L)  Total Bilirubin 0.3 - 1.2 mg/dL - 1.0 0.7  Alkaline Phos 38 - 126 U/L - 82 86  AST 15 - 41 U/L - 68(H) 71(H)  ALT 0 - 44 U/L - 43 41   CBC Latest Ref Rng & Units 06/06/2020 06/05/2020 06/04/2020  WBC 4.0 - 10.5 K/uL 6.5 6.7 13.3(H)  Hemoglobin 13.0 - 17.0 g/dL 11.4(L) 10.9(L) 12.4(L)  Hematocrit 39.0 - 52.0 % 34.0(L) 32.2(L) 36.7(L)  Platelets 150 - 400 K/uL 142(L) 150 196    Lipid Panel No results for input(s): CHOL, TRIG, LDLCALC, VLDL, HDL, CHOLHDL, LDLDIRECT in the last 8760 hours.  HEMOGLOBIN A1C Lab Results  Component Value Date   HGBA1C 10.9 (H) 06/02/2020   MPG 266.13 06/02/2020   TSH No results for input(s): TSH in the last 8760 hours.  External labs:   06/11/2020: PT/INR 1.2 Total cholesterol 129, triglycerides 110, HDL 28, LDL 79, non-HDL 101 AST  45, ALT 24, alk phos 108  Medications and allergies  No Known Allergies   Outpatient Medications Prior to Visit  Medication Sig Dispense Refill  . atorvastatin (LIPITOR) 20 MG tablet Take 20 mg by mouth daily.    . blood glucose meter kit and supplies KIT Dispense based on patient and insurance preference. Use up to four times daily as directed. (FOR ICD-9 250.00, 250.01). For QAC - HS accuchecks. 1 each 1  . ceFAZolin (ANCEF) IVPB Inject 2 g into  the vein every 8 (eight) hours. Indication:  Endocarditis  First Dose: No Last Day of Therapy:  07/14/2020 Labs - Once weekly:  CBC/D and BMP, Labs - Every other week:  ESR and CRP Method of administration: IV Push Method of administration may be changed at the discretion of home infusion pharmacist based upon assessment of the patient and/or caregiver's ability to self-administer the medication ordered. 114 Units 0  . ibuprofen (ADVIL) 200 MG tablet Take 400 mg by mouth every 6 (six) hours as needed for mild pain.    Marland Kitchen insulin aspart (NOVOLOG) 100 UNIT/ML injection Substitute to any brand approved.Before each meal 3 times a day, 140-199 - 2 units, 200-250 - 4 units, 251-299 - 6 units,  300-349 - 8 units,  350 or above 10 units. Dispense syringes and needles as needed, Ok to switch to PEN if approved. DX DM2, Code E11.65 10 mL 0  . insulin glargine (LANTUS) 100 UNIT/ML injection Inject 0.15 mLs (15 Units total) into the skin at bedtime. Dispense insulin pen if approved, if not dispense as needed syringes and needles for 1 month supply. Can switch to Levemir. Diagnosis E 11.65. 10 mL 0  . Insulin Syringe-Needle U-100 25G X 1" 1 ML MISC For 4 times a day insulin SQ, 1 month supply. Diagnosis E11.65 30 each 0  . Multiple Vitamin (MULTIVITAMIN) tablet Take 1 tablet by mouth daily. Centrum MVI-Take one daily     Facility-Administered Medications Prior to Visit  Medication Dose Route Frequency Provider Last Rate Last Admin  . 0.9 %  sodium chloride infusion   500 mL Intravenous Continuous Armbruster, Carlota Raspberry, MD         Radiology:   No results found.  Cardiac Studies:   Transesophageal echocardiogram 06/06/2020: 1. The left ventricle has normal function. The left ventricle has no  regional wall motion abnormalities.  2. Right ventricular systolic function is normal. The right ventricular  size is normal.  3. No left atrial/left atrial appendage thrombus was detected.  4. The mitral valve is thickened. There is a mass on the ventricular  surface of the posterior mitral leaflet, composed of multiple  echodensities. Measures 8 x 15 mm Tethered in chordae Consistent with  vegetation.. Mild mitral valve regurgitation.  5. AV is mildly thickened, sclerotic. Small echodensity consistent with  Lambl's excrescense; cannot exclude tiny vegetation. The aortic valve is  tricuspid. Aortic valve regurgitation is mild.   Transthoracic echocardiogram 06/03/2020: 1. Left ventricular ejection fraction, by estimation, is 60 to 65%. The  left ventricle has normal function. The left ventricle has no regional  wall motion abnormalities. Left ventricular diastolic function could not  be evaluated.  2. Right ventricular systolic function is normal. The right ventricular  size is normal.  3. The mitral valve is normal in structure. No evidence of mitral valve  regurgitation. No evidence of mitral stenosis.  4. The aortic valve is tricuspid. Aortic valve regurgitation is not  visualized. No aortic stenosis is present.   EKG:   EKG 06/23/2020: Sinus rhythm at a rate of 91 bpm.  Left axis, left anterior fascicular block.  Incomplete right bundle branch block.  Poor refreshing, cannot exclude anteroseptal infarct old.  Nonspecific T wave abnormality.  EKG 06/01/2020: Sinus rhythm at a rate of 100 bpm.  Normal axis.  Incomplete right bundle branch block.  Nonspecific ST abnormality, cannot exclude ischemia.  Assessment     ICD-10-CM   1. Endocarditis  of mitral valve  I05.9 EKG 12-Lead  PCV ECHOCARDIOGRAM COMPLETE  2. Need for prophylactic antibiotic - hx of endocraditis   Z79.2   3. Type 2 diabetes mellitus without complication, with long-term current use of insulin (HCC)  E11.9    Z79.4   4. Obesity (BMI 30-39.9)  E66.9   5. Hypercholesterolemia  E78.00      There are no discontinued medications.  Meds ordered this encounter  Medications  . amoxicillin (AMOXIL) 500 MG tablet    Sig: Take 4 tablets (2,000 mg total) by mouth once for 1 dose. Take 30-60 minutes prior to dental procedures.    Dispense:  4 tablet    Refill:  0    Recommendations:   Travis Hampton is a 60 y.o. Caucasian male with history of hyperlipidemia and 82-pack-years, quit in 2017.  Patient also has new diagnosis of type 2 diabetes mellitus, diagnosed 05/2020.  Patient denies history of MI, TIA/CVA, DVT/PE, heart failure, family history of premature CAD.  Patient has had no previous cardiac evaluation, with the exception of recent inpatient echocardiograms.  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  Notably vegetation was not seen on transthoracic echocardiogram during hospitalization.  Patient was discharged with PICC line in plan 6 weeks of IV cefazolin.  Following closely with infectious disease for management of antibiotics for endocarditis.  Patient is presently feeling well without clinical signs of heart failure, he is relatively asymptomatic.  We will plan to repeat echocardiogram after patient has finished IV antibiotics on May 9.  Recommend patient continue to follow closely with infectious disease for management of antibiotics.  Following echocardiogram could consider further cardiac evaluation in view of patient's cardiovascular risk factors including history of tobacco abuse, hyperlipidemia, and type 2  diabetes.  Patient's blood pressure is presently well controlled and I personally reviewed external labs, lipids are well controlled.  Continue atorvastatin at this time.  In view of mitral valve endocarditis, discussed with patient indication for antibiotic prophylaxis prior to dental procedures, colonoscopies, etc.  Patient verbalized understanding and agreement.  I have therefore prescribed amoxicillin 500 mg tablets, and instructed patient to take 2000 mg (4 tablets) once 30 to 60 minutes prior to any procedures.  We will not make further changes to his medications at this time.  Counseled patient regarding diet and lifestyle modifications in order to reduce cardiovascular risk, particularly close follow-up with Dr. Forde Dandy for diabetes management.  Patient appears motivated to make lifestyle changes.  Further recommendations pending repeat echocardiogram after completion of antibiotic course.  Patient was seen in collaboration with Dr. Virgina Jock. He also reviewed patient's chart and examined the patient. Dr. Virgina Jock is in agreement of the plan.   During this visit I reviewed and updated: Tobacco history  allergies medication reconciliation  medical history  surgical history  family history  social history.  This note was created using a voice recognition software as a result there may be grammatical errors inadvertently enclosed that do not reflect the nature of this encounter. Every attempt is made to correct such errors.   Alethia Berthold, PA-C 06/23/2020, 5:17 PM Office: [phone]

## 2020-06-23 ENCOUNTER — Other Ambulatory Visit: Payer: Self-pay

## 2020-06-23 ENCOUNTER — Ambulatory Visit: Payer: 59 | Admitting: Student

## 2020-06-23 ENCOUNTER — Encounter: Payer: Self-pay | Admitting: Student

## 2020-06-23 VITALS — BP 126/74 | HR 101 | Temp 98.8°F | Resp 16 | Ht 74.0 in | Wt 255.0 lb

## 2020-06-23 DIAGNOSIS — E669 Obesity, unspecified: Secondary | ICD-10-CM

## 2020-06-23 DIAGNOSIS — Z792 Long term (current) use of antibiotics: Secondary | ICD-10-CM

## 2020-06-23 DIAGNOSIS — E119 Type 2 diabetes mellitus without complications: Secondary | ICD-10-CM

## 2020-06-23 DIAGNOSIS — E7800 Pure hypercholesterolemia, unspecified: Secondary | ICD-10-CM

## 2020-06-23 DIAGNOSIS — Z794 Long term (current) use of insulin: Secondary | ICD-10-CM

## 2020-06-23 DIAGNOSIS — I059 Rheumatic mitral valve disease, unspecified: Secondary | ICD-10-CM

## 2020-06-23 MED ORDER — AMOXICILLIN 500 MG PO TABS: 2000.0000 mg | ORAL_TABLET | Freq: Once | ORAL | 0 refills | Status: AC

## 2020-06-25 ENCOUNTER — Encounter (HOSPITAL_COMMUNITY): Payer: Self-pay | Admitting: Emergency Medicine

## 2020-06-25 ENCOUNTER — Telehealth: Payer: Self-pay

## 2020-06-25 ENCOUNTER — Other Ambulatory Visit: Payer: Self-pay

## 2020-06-25 ENCOUNTER — Emergency Department (HOSPITAL_COMMUNITY)
Admission: EM | Admit: 2020-06-25 | Discharge: 2020-06-25 | Disposition: A | Discharge status: Home / Self Care | Payer: 59 | Attending: Emergency Medicine | Admitting: Emergency Medicine

## 2020-06-25 DIAGNOSIS — E1169 Type 2 diabetes mellitus with other specified complication: Secondary | ICD-10-CM | POA: Diagnosis not present

## 2020-06-25 DIAGNOSIS — E7800 Pure hypercholesterolemia, unspecified: Secondary | ICD-10-CM | POA: Insufficient documentation

## 2020-06-25 DIAGNOSIS — Z0189 Encounter for other specified special examinations: Secondary | ICD-10-CM

## 2020-06-25 DIAGNOSIS — Z959 Presence of cardiac and vascular implant and graft, unspecified: Secondary | ICD-10-CM | POA: Diagnosis not present

## 2020-06-25 DIAGNOSIS — Z79899 Other long term (current) drug therapy: Secondary | ICD-10-CM | POA: Insufficient documentation

## 2020-06-25 DIAGNOSIS — Z8616 Personal history of COVID-19: Secondary | ICD-10-CM | POA: Diagnosis not present

## 2020-06-25 DIAGNOSIS — Z794 Long term (current) use of insulin: Secondary | ICD-10-CM | POA: Insufficient documentation

## 2020-06-25 DIAGNOSIS — Z01812 Encounter for preprocedural laboratory examination: Secondary | ICD-10-CM | POA: Diagnosis present

## 2020-06-25 DIAGNOSIS — Z87891 Personal history of nicotine dependence: Secondary | ICD-10-CM | POA: Insufficient documentation

## 2020-06-25 LAB — BASIC METABOLIC PANEL
Anion gap: 8 (ref 5–15)
BUN: 6 mg/dL — ABNORMAL LOW (ref 8–23)
CO2: 28 mmol/L (ref 22–32)
Calcium: 8.9 mg/dL (ref 8.9–10.3)
Chloride: 103 mmol/L (ref 98–111)
Creatinine, Ser: 0.52 mg/dL — ABNORMAL LOW (ref 0.61–1.24)
GFR, Estimated: >60 mL/min (ref >60)
Glucose, Bld: 107 mg/dL — ABNORMAL HIGH (ref 70–99)
Potassium: 3.4 mmol/L — ABNORMAL LOW (ref 3.5–5.1)
Sodium: 139 mmol/L (ref 135–145)

## 2020-06-25 LAB — I-STAT CHEM 8, ED
BUN: 7 mg/dL — ABNORMAL LOW (ref 8–23)
Calcium, Ion: 0.95 mmol/L — ABNORMAL LOW (ref 1.15–1.40)
Chloride: 105 mmol/L (ref 98–111)
Creatinine, Ser: 0.4 mg/dL — ABNORMAL LOW (ref 0.61–1.24)
Glucose, Bld: 126 mg/dL — ABNORMAL HIGH (ref 70–99)
HCT: 36 % — ABNORMAL LOW (ref 39.0–52.0)
Hemoglobin: 12.2 g/dL — ABNORMAL LOW (ref 13.0–17.0)
Potassium: 3.9 mmol/L (ref 3.5–5.1)
Sodium: 140 mmol/L (ref 135–145)
TCO2: 27 mmol/L (ref 22–32)

## 2020-06-25 LAB — CBC
HCT: 39.3 % (ref 39.0–52.0)
Hemoglobin: 12.5 g/dL — ABNORMAL LOW (ref 13.0–17.0)
MCH: 33.5 pg (ref 26.0–34.0)
MCHC: 31.8 g/dL (ref 30.0–36.0)
MCV: 105.4 fL — ABNORMAL HIGH (ref 80.0–100.0)
Platelets: 90 10*3/uL — ABNORMAL LOW (ref 150–400)
RBC: 3.73 MIL/uL — ABNORMAL LOW (ref 4.22–5.81)
RDW: 13.7 % (ref 11.5–15.5)
WBC: 3.9 10*3/uL — ABNORMAL LOW (ref 4.0–10.5)
nRBC: 0 % (ref 0.0–0.2)

## 2020-06-25 NOTE — ED Notes (Signed)
CBC and BMP have been recollected and sent to lab

## 2020-06-25 NOTE — ED Notes (Signed)
Called lab requesting update on CBC and BMP collected for pt. Labs was unable to give update. Will return phone call when locating the blood sample.

## 2020-06-25 NOTE — Telephone Encounter (Signed)
I am not sure if this is a spurious lab value since they are both very abnormal, however, given how low his potassium & calcium is it needs to be repeated ASAP.  I would recommend that he go somewhere to have this done (ie ED) since having home health go out to repeat this may result in a delay which would be problematic in case this is an accurate lab value.  Thanks, Mitzi Hansen

## 2020-06-25 NOTE — ED Triage Notes (Signed)
Pt here sent from MD office for possible low k and ca , no complaints from pt

## 2020-06-25 NOTE — ED Provider Notes (Signed)
[facility] EMERGENCY DEPARTMENT Provider Note   CSN: [id number] Arrival date & time: 06/25/20  1412     History No chief complaint on file.   Travis Hampton is a 60 y.o. male with a history of diabetes mellitus, hyperlipidemia, chronic cirrhosis.  Patient is currently being treated with cefazolin via PICC line for MSSA bacteremia and endocarditis.  Patient presents today with a chief complaint of abnormal labs.  Patient states he had home health nurse draw lab work yesterday from his PICC line.  Today he was contacted by his primary care provider who reported that his potassium was 2 and he needed to go to the emergency department for reevaluation.  Patient denies any nausea, vomiting, diarrhea, fever, chills, chest pain, shortness of breath.      HPI     Past Medical History:  Diagnosis Date  . Arthritis   . Diabetes mellitus without complication ([place])   . High cholesterol   . Numbness    left side face due to tree limb accident, after accident October 2016  . Shoulder pain, left    left shoulder pain  . Torn ligament    left shoulder- found out September 05, 2015    Patient Active Problem List   Diagnosis Date Noted  . 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 Type 2 diabetes mellitus without complications ([place]) 84/13/2440  . COVID-19 virus infection 06/01/2020    Past Surgical History:  Procedure Laterality Date  . EYE SURGERY     limb fell on cherry picker and pt was thrown out/12/2014  . LEG SURGERY     due to injury from cherry picker in 01/04/15  . TEE WITHOUT CARDIOVERSION N/A 06/06/2020   Procedure: TRANSESOPHAGEAL ECHOCARDIOGRAM (TEE);  Surgeon: Fay Records, MD;  Location: [facility] ENDOSCOPY;  Service: Cardiovascular;  Laterality: N/A;       Family History  Problem Relation Age of Onset  . Colon cancer Cousin   . Esophageal cancer Neg Hx   . Rectal cancer Neg Hx   . Stomach cancer Neg Hx     Social History   Tobacco Use  . Smoking  status: Former Smoker    Packs/day: 0.50    Years: 41.00    Pack years: 20.50    Types: Cigarettes    Quit date: 2017    Years since quitting: 5.3  . Smokeless tobacco: Former Systems developer    Quit date: 09/29/1972  . Tobacco comment: quit July 2017  Vaping Use  . Vaping Use: Never used  Substance Use Topics  . Alcohol use: No  . Drug use: No    Home Medications Prior to Admission medications   Medication Sig Start Date End Date Taking? Authorizing Provider  atorvastatin (LIPITOR) 20 MG tablet Take 20 mg by mouth daily.    [provider]  blood glucose meter kit and supplies KIT Dispense based on patient and insurance preference. Use up to four times daily as directed. (FOR ICD-9 250.00, 250.01). For QAC - HS accuchecks. 06/07/20   Thurnell Lose, MD  ceFAZolin (ANCEF) IVPB Inject 2 g into the vein every 8 (eight) hours. Indication:  Endocarditis  First Dose: No Last Day of Therapy:  07/14/2020 Labs - Once weekly:  CBC/D and BMP, Labs - Every other week:  ESR and CRP Method of administration: IV Push Method of administration may be changed at the discretion of home infusion pharmacist based upon assessment of the patient and/or caregiver's ability to self-administer  the medication ordered. 06/07/20 07/15/20  Thurnell Lose, MD  ibuprofen (ADVIL) 200 MG tablet Take 400 mg by mouth every 6 (six) hours as needed for mild pain.    [provider]  insulin aspart (NOVOLOG) 100 UNIT/ML injection Substitute to any brand approved.Before each meal 3 times a day, 140-199 - 2 units, 200-250 - 4 units, 251-299 - 6 units,  300-349 - 8 units,  350 or above 10 units. Dispense syringes and needles as needed, Ok to switch to PEN if approved. DX DM2, Code E11.65 06/07/20   Thurnell Lose, MD  insulin glargine (LANTUS) 100 UNIT/ML injection Inject 0.15 mLs (15 Units total) into the skin at bedtime. Dispense insulin pen if approved, if not dispense as needed syringes and needles for 1 month  supply. Can switch to Levemir. Diagnosis E 11.65. 06/07/20   Thurnell Lose, MD  Insulin Syringe-Needle U-100 25G X 1" 1 ML MISC For 4 times a day insulin SQ, 1 month supply. Diagnosis E11.65 06/07/20   Thurnell Lose, MD  Multiple Vitamin (MULTIVITAMIN) tablet Take 1 tablet by mouth daily. Centrum MVI-Take one daily    [provider]    Allergies    Patient has no known allergies.  Review of Systems   Review of Systems  Constitutional: Negative for chills and fever.  Respiratory: Negative for shortness of breath.   Cardiovascular: Negative for chest pain.  Gastrointestinal: Negative for abdominal pain, diarrhea, nausea and vomiting.  Skin: Negative for color change, pallor, rash and wound.  Neurological: Negative for dizziness, syncope, weakness, light-headedness and numbness.  Psychiatric/Behavioral: Negative for confusion.    Physical Exam Updated Vital Signs BP 131/65   Pulse 92   Temp 98.8 F (37.1 C)   Resp 16   SpO2 98%   Physical Exam Vitals and nursing note reviewed.  Constitutional:      General: He is not in acute distress.    Appearance: He is obese. He is not ill-appearing, toxic-appearing or diaphoretic.  HENT:     Head: Normocephalic.  Eyes:     General: No scleral icterus.       Right eye: No discharge.        Left eye: No discharge.  Cardiovascular:     Rate and Rhythm: Normal rate.  Pulmonary:     Effort: Pulmonary effort is normal. No respiratory distress.     Breath sounds: No stridor.  Abdominal:     General: Abdomen is protuberant.     Palpations: Abdomen is soft. There is no mass or pulsatile mass.     Tenderness: There is no abdominal tenderness. There is no guarding or rebound.  Musculoskeletal:     Comments: PICC line to right upper arm. No surrounding erythema, rash, or purulent drainage noted  Skin:    General: Skin is warm and dry.  Neurological:     General: No focal deficit present.     Mental Status: He is alert.      GCS: GCS eye subscore is 4. GCS verbal subscore is 5. GCS motor subscore is 6.  Psychiatric:        Behavior: Behavior is cooperative.     ED Results / Procedures / Treatments   Labs (all labs ordered are listed, but only abnormal results are displayed) Labs Reviewed  CBC - Abnormal; Notable for the following components:      Result Value   WBC 3.9 (*)    RBC 3.73 (*)    Hemoglobin  12.5 (*)    MCV 105.4 (*)    Platelets 90 (*)    All other components within normal limits  BASIC METABOLIC PANEL - Abnormal; Notable for the following components:   Potassium 3.4 (*)    Glucose, Bld 107 (*)    BUN 6 (*)    Creatinine, Ser 0.52 (*)    All other components within normal limits  I-STAT CHEM 8, ED - Abnormal; Notable for the following components:   BUN 7 (*)    Creatinine, Ser 0.40 (*)    Glucose, Bld 126 (*)    Calcium, Ion 0.95 (*)    Hemoglobin 12.2 (*)    HCT 36.0 (*)    All other components within normal limits    EKG EKG Interpretation  Date/Time:  Wednesday June 25 2020 14:34:12 EDT Ventricular Rate:  94 PR Interval:  200 QRS Duration: 102 QT Interval:  388 QTC Calculation: 485 R Axis:   21 Text Interpretation: Normal sinus rhythm Prolonged QT Abnormal ECG Since last tracing QT has shortened Otherwise no significant change Confirmed by Daleen Bo [phone]) on 06/25/2020 4:20:33 PM   Radiology No results found.  Procedures Procedures   Medications Ordered in ED Medications - No data to display  ED Course  I have reviewed the triage vital signs and the nursing notes.  Pertinent labs & imaging results that were available during my care of the patient were reviewed by me and considered in my medical decision making (see chart for details).    MDM Rules/Calculators/A&P                          Alert 60 year old male no acute distress, nontoxic-appearing.  Patient presents with chief complaint of abnormal lab.  Patient was told that his potassium was 2.   Presents to the emergency department today for reevaluation.  Patient denies any fevers, chills, nausea, vomiting, diarrhea, chest pain, shortness of breath.  EKG, BMP, CBC obtained while patient was in triage.  EKG shows sinus rhythm.    RN contacted lab who reports that patient's initial BMP and CBC were unable to be found. BMP, CBC reordered.  Will order i-STAT Chem-8 to quickly correct any acute change to potassium.  I-STAT Chem-8 showed potassium at 3.9 no potassium supplementation needed at this time.  BMP pending for confirmation.  BMP shows potassium at 3.4.    CBC shows slight leukopenia at 3.9, anemia with hemoglobin at 12.5 and platelets at 90.  Patient's anemia improved from previous lab work obtained 2 weeks prior.  Thrombocytopenia appears to have worsened from 2 weeks prior when platelets were 142.  We will have patient follow-up with his primary care provider for close follow-up.    Final Clinical Impression(s) / ED Diagnoses Final diagnoses:  Encounter for routine laboratory testing    Rx / DC Orders ED Discharge Orders    None       Dyann Ruddle 06/25/20 2137    Daleen Bo, MD 06/26/20 [phone]

## 2020-06-25 NOTE — ED Triage Notes (Signed)
Emergency Medicine Provider Triage Evaluation Note  Travis Hampton , a 60 y.o. male  was evaluated in triage.  Pt presents for evaluation of abnormal lab work.  Patient had routine labs drawn by home health nurse from his PICC line.  He has a PICC line in place to receive home IV antibiotics for subacute endocarditis.  He was found to have a potassium of 2.0 and a calcium of 4.8, they are concerned this could be lab error and wanted to have them rechecked.  Patient denies any symptoms associated with these abnormal labs.  Review of Systems  Positive: None Negative: Chest pain, shortness of breath, fever  Physical Exam  BP 140/76 (BP Location: Left Arm)   Pulse (!) 101   Temp 98.8 F (37.1 C)   Resp 16   SpO2 97%  Gen:   Awake, no distress   HEENT:  Atraumatic  Resp:  Normal effort  Cardiac:  Normal rate  Abd:   Nondistended MSK:   Moves extremities without difficulty, PICC line present in the right [facility]:  Speech clear   Medical Decision Making  Medically screening exam initiated at 2:22 PM.  Appropriate orders placed.  Travis Hampton was informed that the remainder of the evaluation will be completed by another provider, this initial triage assessment does not replace that evaluation, and the importance of remaining in the ED until their evaluation is complete.  Clinical Impression  1.  Abnormal lab   Jacqlyn Larsen, [place] 06/25/20 1428

## 2020-06-25 NOTE — ED Notes (Signed)
Called lab  Informed no blood was received for pt. Request recollection

## 2020-06-25 NOTE — Discharge Instructions (Signed)
Came to the emergency department today due to reports of low potassium.  Your lab work checked today showed that your potassium was 3.4.  Please follow-up with your primary care provider.

## 2020-06-25 NOTE — Telephone Encounter (Signed)
Spoke with patient and instructed him to go to the ED for lab redraw. Explained that office received critical lab values and our concern about their accuracy and implications. Explained that these could be incorrect but provider would rather get them redrawn sooner than wait for home health to return. Patient stated he would go to the ED now for re-draw. Provider notified. No complaints of chest pain or numbness/tingling noted. Patient reports feeling fine.   Athena Baltz Lorita Officer, RN

## 2020-06-25 NOTE — Telephone Encounter (Signed)
We received a call from [place] with [facility] relaying critical labs on the patient. Patient had labs drawn yesterday and potassium is a 2.0 and calcium 4.8.  Please advise.

## 2020-06-28 NOTE — Telephone Encounter (Signed)
Stuck in box trying to remove

## 2020-07-03 ENCOUNTER — Telehealth: Payer: Self-pay

## 2020-07-03 NOTE — Telephone Encounter (Signed)
Spoke with Jackelyn Poling who will contact home health for lab order. Will try to have repeat CBC done today.

## 2020-07-03 NOTE — Telephone Encounter (Signed)
Spoke with patient who states that he has never been on IV Vancomycin. Spoke with Jackelyn Poling who is able to have home health administer first dose at  home.  Leatrice Jewels, RMA

## 2020-07-03 NOTE — Telephone Encounter (Signed)
Received critical lab value from Anderson Malta, [place] with home health. Patient's platelet count is 93.  P: [phone]

## 2020-07-04 NOTE — Telephone Encounter (Signed)
Patient called concerned that [facility] had not received orders for new IV abx and he's due for his next infusion this afternoon at 2p. Contacted [facility] who confirmed that orders have been emailed x2 and faxed x2 to [facility] and they should be going by today for first dose teaching and implementation. Patient notified. No further questions at this time.  Sharryn Belding Lorita Officer, RN

## 2020-07-08 ENCOUNTER — Encounter: Payer: Self-pay | Admitting: Internal Medicine

## 2020-07-10 ENCOUNTER — Other Ambulatory Visit: Payer: Self-pay

## 2020-07-10 ENCOUNTER — Ambulatory Visit (INDEPENDENT_AMBULATORY_CARE_PROVIDER_SITE_OTHER): Payer: 59 | Admitting: Internal Medicine

## 2020-07-10 ENCOUNTER — Encounter: Payer: Self-pay | Admitting: Internal Medicine

## 2020-07-10 VITALS — BP 134/79 | HR 90 | Temp 97.9°F | Wt 256.0 lb

## 2020-07-10 DIAGNOSIS — Z794 Long term (current) use of insulin: Secondary | ICD-10-CM

## 2020-07-10 DIAGNOSIS — E119 Type 2 diabetes mellitus without complications: Secondary | ICD-10-CM | POA: Diagnosis not present

## 2020-07-10 DIAGNOSIS — Z452 Encounter for adjustment and management of vascular access device: Secondary | ICD-10-CM

## 2020-07-10 DIAGNOSIS — I059 Rheumatic mitral valve disease, unspecified: Secondary | ICD-10-CM

## 2020-07-10 NOTE — Progress Notes (Signed)
Patient ID: Travis Hampton, male   DOB: [birth year], 60 y.o.   MRN: [id number]   Subjective:    Patient ID: Travis Hampton, male    DOB: Jan 05, 1961, 60 y.o.   MRN: [id number]  HPI Here for hospital follow up He was hospitalized last month with what appeared to be symptomatic COVID-19 infection with SOB but blood cultures were positive for Staph aureus and TEE noted a mitral valve endocarditis.  Also found was a new diagnosis of diabetes.  He was started on cefazolin and he continues on this for a projected 6 weeks through Jul 14, 2020. He continues to do well with no new concerns.  He is becoming more and more active.  No associated rash or diarrhea.  No issues with the picc line.   Review of Systems  Constitutional: Negative for chills and fever.  Gastrointestinal: Negative for diarrhea and nausea.  Skin: Negative for rash.  Psychiatric/Behavioral: Negative for sleep disturbance.       Objective:   Physical Exam Constitutional:      Appearance: Normal appearance.  Eyes:     General: No scleral icterus. Pulmonary:     Effort: Pulmonary effort is normal.  Musculoskeletal:     Right lower leg: No edema.     Left lower leg: No edema.  Skin:    Findings: No rash.  Neurological:     General: No focal deficit present.     Mental Status: He is alert.  Psychiatric:        Mood and Affect: Mood normal.   SH: remote tobacco use        Assessment & Plan:

## 2020-07-10 NOTE — Progress Notes (Signed)
Per MD called [facility] with orders to pull picc after last dose on 5/9. Spoke with Jackelyn Poling who will relay orders to [facility].  Leatrice Jewels, RMA

## 2020-07-10 NOTE — Assessment & Plan Note (Signed)
He continues to work on his diet and glucose levels and has lost a significant amount of weight purposefully. I encouraged continued efforts.

## 2020-07-10 NOTE — Assessment & Plan Note (Signed)
Doing well and no concerning signs clinically so will plan to finish antibiotic treatment on 5/9 as planned.  Discussed with the patient including return precautions of otherwise unexplained fever, chills.

## 2020-07-10 NOTE — Assessment & Plan Note (Signed)
He will have this removed by home health after his last dose.

## 2020-07-16 ENCOUNTER — Ambulatory Visit: Payer: 59

## 2020-07-16 ENCOUNTER — Other Ambulatory Visit: Payer: Self-pay

## 2020-07-16 DIAGNOSIS — I059 Rheumatic mitral valve disease, unspecified: Secondary | ICD-10-CM

## 2020-07-21 NOTE — Progress Notes (Signed)
Primary Physician/Referring:  Reynold Bowen, MD  Patient ID: Travis Hampton, male    DOB: 1960-08-02, 60 y.o.   MRN: [id number]  Chief Complaint  Patient presents with  . Endocarditis of mitral valve  . Follow-up  . Results   HPI:    Travis Hampton  is a 60 y.o. Caucasian male with history of hyperlipidemia and 82-pack-years, quit in 2017.  Patient also has new diagnosis of type 2 diabetes mellitus, diagnosed 05/2020.  Patient denies history of MI, TIA/CVA, DVT/PE, heart failure, family history of premature CAD.  Patient has had no previous cardiac evaluation, with the exception of recent inpatient echocardiograms.  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 Patient was discharged with IV antibiotics which patient has now completed per management of infectious disease.  Patient's PICC line has been removed.  Patient presents for 4 week follow up of endocarditis. Repeat echocardiogram revealed evidence of healed vegetation.  Patient reports he is presently feeling well, remains asymptomatic.  Denies chest pain, palpitations, dyspnea, leg swelling, orthopnea, PND.  Denies fevers, chills.  Patient has made significant diet modifications and has slowly been increasing his physical activity.  He is presently able to walk about the length of a football field and back without issue.  Past Medical History:  Diagnosis Date  . Arthritis   . Diabetes mellitus without complication ([place])   . High cholesterol   . Numbness    left side face due to tree limb accident, after accident October 2016  . Shoulder pain, left    left shoulder pain  . Torn ligament    left shoulder- found out September 05, 2015   Past Surgical History:  Procedure Laterality Date  . EYE SURGERY     limb fell on cherry picker and pt was thrown out/12/2014  . LEG SURGERY     due  to injury from cherry picker in 01/04/15  . TEE WITHOUT CARDIOVERSION N/A 06/06/2020   Procedure: TRANSESOPHAGEAL ECHOCARDIOGRAM (TEE);  Surgeon: Fay Records, MD;  Location[facility] ENDOSCOPY;  Service: Cardiovascular;  Laterality: N/A;   Family History  Problem Relation Age of Onset  . Colon cancer Cousin   . Esophageal cancer Neg Hx   . Rectal cancer Neg Hx   . Stomach cancer Neg Hx     Social History   Tobacco Use  . Smoking status: Former Smoker    Packs/day: 0.50    Years: 41.00    Pack years: 20.50    Types: Cigarettes    Quit date: 2017    Years since quitting: 5.3  . Smokeless tobacco: Former Systems developer    Quit date: 09/29/1972  . Tobacco comment: quit July 2017  Substance Use Topics  . Alcohol use: No   Marital Status: Married   ROS  Review of Systems  Constitutional: Negative for malaise/fatigue and weight gain.  Cardiovascular: Negative for chest pain, claudication, leg swelling, near-syncope, orthopnea, palpitations, paroxysmal nocturnal dyspnea and syncope.  Respiratory: Negative for shortness of breath.   Hematologic/Lymphatic: Does not bruise/bleed easily.  Gastrointestinal: Negative for melena.  Neurological: Negative for dizziness and weakness.    Objective  Blood pressure 130/65, pulse 74, temperature 98.5 F (36.9 C), height 6' 2"  (1.88 m), weight 261 lb (118.4 kg), SpO2 94 %.  Vitals with BMI 07/22/2020 07/10/2020 06/25/2020  Height 6' 2"  - -  Weight 261 lbs 256 lbs -  BMI 37.9 02.40 -  Systolic 973 532 992  Diastolic 65 79 72  Pulse 74 90 -  Some encounter information is confidential and restricted. Go to Review Flowsheets activity to see all data.      Physical Exam Vitals reviewed.  Constitutional:      Appearance: He is obese.  HENT:     Head: Normocephalic and atraumatic.  Cardiovascular:     Rate and Rhythm: Normal rate and regular rhythm.     Pulses: Intact distal pulses.     Heart sounds: S1 normal and S2 normal. Murmur heard.   Blowing  holosystolic murmur is present with a grade of 2/6 at the apex. No gallop.   Pulmonary:     Effort: Pulmonary effort is normal. No respiratory distress.     Breath sounds: No wheezing, rhonchi or rales.  Musculoskeletal:     Right lower leg: Edema (1+ pitting) present.     Left lower leg: Edema (1+ pitting) present.  Neurological:     Mental Status: He is alert.     Laboratory examination:   Recent Labs    06/06/20 0618 06/07/20 0756 06/25/20 1426 06/25/20 1627  NA 141 134* 139 140  K 3.4* 3.7 3.4* 3.9  CL 103 97* 103 105  CO2 32 29 28  --   GLUCOSE 105* 214* 107* 126*  BUN 15 14 6* 7*  CREATININE 0.70 0.72 0.52* 0.40*  CALCIUM 8.1* 8.1* 8.9  --   GFRNONAA >60 >60 >60  --    CrCl cannot be calculated (Patient's most recent lab result is older than the maximum 21 days allowed.).  CMP Latest Ref Rng & Units 06/25/2020 06/25/2020 06/07/2020  Glucose 70 - 99 mg/dL 126(H) 107(H) 214(H)  BUN 8 - 23 mg/dL 7(L) 6(L) 14  Creatinine 0.61 - 1.24 mg/dL 0.40(L) 0.52(L) 0.72  Sodium 135 - 145 mmol/L 140 139 134(L)  Potassium 3.5 - 5.1 mmol/L 3.9 3.4(L) 3.7  Chloride 98 - 111 mmol/L 105 103 97(L)  CO2 22 - 32 mmol/L - 28 29  Calcium 8.9 - 10.3 mg/dL - 8.9 8.1(L)  Total Protein 6.5 - 8.1 g/dL - - -  Total Bilirubin 0.3 - 1.2 mg/dL - - -  Alkaline Phos 38 - 126 U/L - - -  AST 15 - 41 U/L - - -  ALT 0 - 44 U/L - - -   CBC Latest Ref Rng & Units 06/25/2020 06/25/2020 06/06/2020  WBC 4.0 - 10.5 K/uL - 3.9(L) 6.5  Hemoglobin 13.0 - 17.0 g/dL 12.2(L) 12.5(L) 11.4(L)  Hematocrit 39.0 - 52.0 % 36.0(L) 39.3 34.0(L)  Platelets 150 - 400 K/uL - 90(L) 142(L)    Lipid Panel No results for input(s): CHOL, TRIG, LDLCALC, VLDL, HDL, CHOLHDL, LDLDIRECT in the last 8760 hours.  HEMOGLOBIN A1C Lab Results  Component Value Date   HGBA1C 10.9 (H) 06/02/2020   MPG 266.13 06/02/2020   TSH No results for input(s): TSH in the last 8760 hours.  External labs:   06/11/2020: PT/INR 1.2 Total  cholesterol 129, triglycerides 110, HDL 28, LDL 79, non-HDL 101 AST 45, ALT 24, alk phos 108  Medications and allergies   Allergies  Allergen Reactions  . Testosterone Rash     Outpatient Medications Prior to Visit  Medication Sig Dispense Refill  . atorvastatin (LIPITOR) 20 MG tablet Take 20 mg by mouth daily.    . blood glucose meter kit and supplies KIT Dispense based on patient and insurance preference. Use up to  four times daily as directed. (FOR ICD-9 250.00, 250.01). For QAC - HS accuchecks. 1 each 1  . ibuprofen (ADVIL) 200 MG tablet Take 400 mg by mouth every 6 (six) hours as needed for mild pain.    Marland Kitchen insulin aspart (NOVOLOG) 100 UNIT/ML injection Substitute to any brand approved.Before each meal 3 times a day, 140-199 - 2 units, 200-250 - 4 units, 251-299 - 6 units,  300-349 - 8 units,  350 or above 10 units. Dispense syringes and needles as needed, Ok to switch to PEN if approved. DX DM2, Code E11.65 10 mL 0  . insulin glargine (LANTUS) 100 UNIT/ML injection Inject 0.15 mLs (15 Units total) into the skin at bedtime. Dispense insulin pen if approved, if not dispense as needed syringes and needles for 1 month supply. Can switch to Levemir. Diagnosis E 11.65. 10 mL 0  . Insulin Syringe-Needle U-100 25G X 1" 1 ML MISC For 4 times a day insulin SQ, 1 month supply. Diagnosis E11.65 30 each 0  . Multiple Vitamin (MULTIVITAMIN) tablet Take 1 tablet by mouth daily. Centrum MVI-Take one daily     Facility-Administered Medications Prior to Visit  Medication Dose Route Frequency Provider Last Rate Last Admin  . 0.9 %  sodium chloride infusion  500 mL Intravenous Continuous Armbruster, Carlota Raspberry, MD         Radiology:   No results found.  Cardiac Studies:   PCV ECHOCARDIOGRAM COMPLETE 74/10/1446 Normal LV systolic function with visual EF 60-65%. Left ventricle cavity is normal in size. Normal global wall motion. Normal diastolic filling pattern, normal LAP. Native mitral valve with  echogenicity of posterior annulus / leaflet resembling either calcification or healed vegetation. Mild tricuspid regurgitation. No evidence of pulmonary hypertension. Prior TEE 4/1/12022:  Mass on the ventricular surface of the posterior mitral leaflet, composed of multiple echodensities. Measures 8 x 15 mm Tethered in chordae Consistent with vegetation.. Mild mitral valve regurgitation. Small echodensity consistent with Lambl's excrescens; cannot exclude tiny vegetation. Recommendation: No evidence of vegetation may consider TEE if clinically indicated.   Transesophageal echocardiogram 06/06/2020: 1. The left ventricle has normal function. The left ventricle has no regional wall motion abnormalities.  2. Right ventricular systolic function is normal. The right ventricular size is normal.  3. No left atrial/left atrial appendage thrombus was detected.  4. The mitral valve is thickened. There is a mass on the ventricular surface of the posterior mitral leaflet, composed of multiple echodensities. Measures 8 x 15 mm Tethered in chordae Consistent with vegetation.. Mild mitral valve regurgitation.  5. AV is mildly thickened, sclerotic. Small echodensity consistent with Lambl's excrescense; cannot exclude tiny vegetation. The aortic valve is tricuspid. Aortic valve regurgitation is mild.   EKG:   EKG 06/23/2020: Sinus rhythm at a rate of 91 bpm.  Left axis, left anterior fascicular block.  Incomplete right bundle branch block.  Poor refreshing, cannot exclude anteroseptal infarct old.  Nonspecific T wave abnormality.  EKG 06/01/2020: Sinus rhythm at a rate of 100 bpm.  Normal axis.  Incomplete right bundle branch block.  Nonspecific ST abnormality, cannot exclude ischemia.  Assessment     ICD-10-CM   1. Endocarditis of mitral valve  I05.9   2. Need for prophylactic antibiotic - hx of endocraditis   Z79.2   3. Hypercholesterolemia  E78.00      There are no discontinued medications.  Meds  ordered this encounter  Medications  . furosemide (LASIX) 20 MG tablet    Sig: Take 1 tablet (  20 mg total) by mouth daily as needed for fluid or edema.    Dispense:  30 tablet    Refill:  3    Recommendations:   Travis Hampton is a 60 y.o. Caucasian male with history of hyperlipidemia and 82-pack-years, quit in 2017.  Patient also has new diagnosis of type 2 diabetes mellitus, diagnosed 05/2020.  Patient denies history of MI, TIA/CVA, DVT/PE, heart failure, family history of premature CAD.  Patient has had no previous cardiac evaluation, with the exception of recent inpatient echocardiograms.  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 Patient was discharged with IV antibiotics which patient has now completed per management of infectious disease.  PICC line has been removed.  Patient presents for 4 week follow up of endocarditis. Repeat echocardiogram revealed evidence of healed vegetation.  Patient is feeling well without clinical signs of heart failure.  He has finished antibiotics and echocardiogram was essentially normal with exception of evidence of healed vegetation on mitral valve.  In view of patient's multiple cardiovascular risk factors discussed further risk stratification and cardiac evaluation including proceeding with stress test versus coronary calcium score.  Patient prefers to hold off on further cardiac evaluation, and rather continue with diet and lifestyle modifications prior to additional testing.  Notably patient has upcoming appointment with PCP which time we will have repeat lipid panel and A1c, if lipid panel has not been done by next visit we will plan to repeat in our office.  We will reconsider further cardiac evaluation and restratification at next office visit.  Patient's blood pressure is well controlled and lipids are also  typically well controlled with LDL at 79.  Advised patient LDL goal would be <70.  Discussed at length with patient regarding diet and lifestyle modifications in order to lose weight and reduce cardiovascular risk.  Patient appears motivated.  Will not make changes to his medications at this time.  In view of mitral valve endocarditis, discussed with patient indication for antibiotic prophylaxis prior to dental procedures, colonoscopies, etc.  Patient verbalized understanding and agreement.  I have therefore prescribed amoxicillin 500 mg tablets, and instructed patient to take 2000 mg (4 tablets) once 30 to 60 minutes prior to any procedures.    Follow-up in 6 months, sooner if needed, for hyperlipidemia, endocarditis, and cardiovascular risk management.    Alethia Berthold, PA-C 07/22/2020, 11:18 AM Office: [phone]

## 2020-07-22 ENCOUNTER — Other Ambulatory Visit: Payer: Self-pay

## 2020-07-22 ENCOUNTER — Encounter: Payer: Self-pay | Admitting: Student

## 2020-07-22 ENCOUNTER — Ambulatory Visit: Payer: 59 | Admitting: Student

## 2020-07-22 VITALS — BP 130/65 | HR 74 | Temp 98.5°F | Ht 74.0 in | Wt 261.0 lb

## 2020-07-22 DIAGNOSIS — I059 Rheumatic mitral valve disease, unspecified: Secondary | ICD-10-CM

## 2020-07-22 DIAGNOSIS — E7800 Pure hypercholesterolemia, unspecified: Secondary | ICD-10-CM

## 2020-07-22 DIAGNOSIS — Z792 Long term (current) use of antibiotics: Secondary | ICD-10-CM

## 2020-07-22 MED ORDER — FUROSEMIDE 20 MG PO TABS
20.0000 mg | ORAL_TABLET | Freq: Every day | ORAL | 3 refills | Status: DC | PRN
Start: 2020-07-22 — End: 2021-01-22

## 2020-09-09 NOTE — ED Provider Notes (Signed)
Travis Meadow, PA-C  Physician Assistant  Emergency Medicine  ED Provider Notes      Signed  Date of Service:  06/01/2020  3:28 PM           Signed      Expand All Collapse All                                                                                                                                                                                                                                                                                                                                     Travis Hampton       CSN: [id number] Arrival date & time: 06/01/20  1417       History              Chief Complaint    Chief Complaint  Patient presents with   Shortness of Breath      HPI Travis Hampton is a 60 y.o. male.    The history is provided by the patient. No language interpreter was used.  Shortness of Breath Severity:  Moderate Onset quality:  Gradual Duration:  3 days Timing:  Constant Progression:  Worsening Chronicity:  New Relieved by:  Nothing Worsened by:  Nothing Ineffective treatments:  None tried Associated symptoms: no abdominal pain   [facility] for leg swelling.  Pt had ct angio and no pe.  Pt reports he is progressively becoming more short of breath.  Pt recntley diganosed woth diabetes       Past Medical History:  Diagnosis Date   Arthritis     Diabetes mellitus without complication ([place])     High cholesterol     Numbness      left side face due to tree limb accident, after accident October 2016   Shoulder pain, left  left shoulder pain   Torn ligament      left shoulder- found out September 05, 2015      There are no problems to display for this patient.          Past Surgical History:  Procedure Laterality Date   EYE SURGERY        limb fell on cherry picker and pt was thrown  out/12/2014   LEG SURGERY        due to injury from cherry picker in 01/04/15          Home Medications                       Prior to Admission medications  Medication Sig Start Date End Date Taking? Authorizing Provider  atorvastatin (LIPITOR) 40 MG tablet Take 20 mg by mouth daily.       [provider]  metFORMIN (GLUCOPHAGE) 500 MG tablet Take 1 tablet (500 mg total) by mouth 2 (two) times daily with a meal. 05/29/20 06/28/20   Venter, Margaux, PA-C  Multiple Vitamin (MULTIVITAMIN) tablet Take 1 tablet by mouth daily. Centrum MVI-Take one daily       [provider]      Family History      Family History  Problem Relation Age of Onset   Colon cancer Cousin     Esophageal cancer Neg Hx     Rectal cancer Neg Hx     Stomach cancer Neg Hx        Social History Social History         Tobacco Use   Smoking status: Former Smoker      Packs/day: 0.50      Types: Cigarettes   Smokeless tobacco: Former Systems developer      Quit date: 09/29/1972   Tobacco comment: quit July 2017  Substance Use Topics   Alcohol use: No   Drug use: No        Allergies              Patient has no known allergies.     Review of Systems Review of Systems Respiratory: Positive for shortness of breath.   Gastrointestinal: Negative for abdominal pain.  All other systems reviewed and are negative.       Physical Exam Triage Vital Signs     ED Triage Vitals  Enc Vitals Group     BP 06/01/20 1433 (!) 112/50     Pulse Rate 06/01/20 1433 (!) 101     Resp 06/01/20 1433 17     Temp 06/01/20 1433 (!) 101.4 F (38.6 C)     Temp src --       SpO2 06/01/20 1433 94 %     Weight --       Height --       Head Circumference --       Peak Flow --       Pain Score 06/01/20 1426 10     Pain Loc --       Pain Edu? --       Excl. in [place]? --      No data found.   Updated Vital Signs BP (!) 112/50 (BP Location: Right Arm)   Pulse (!) 101   Temp (!) 101.4 F (38.6 C)   Resp 17    SpO2 94%    Visual Acuity Right Eye Distance:      Left Eye Distance:  Bilateral Distance:           Right Eye Near:            Left Eye Near:               Bilateral Near:                 Physical Exam Vitals and nursing note reviewed.  Constitutional:      Appearance: He is well-developed.  HENT:    Head: Normocephalic and atraumatic.  Eyes:    Conjunctiva/sclera: Conjunctivae normal.  Cardiovascular:    Rate and Rhythm: Normal rate and regular rhythm.     Heart sounds: No murmur heard.    Pulmonary:    Effort: Tachypnea present. No respiratory distress.     Breath sounds: Decreased breath sounds present.  Abdominal:     Palpations: Abdomen is soft.     Tenderness: There is no abdominal tenderness. Musculoskeletal:     Cervical back: Neck supple. Skin:    General: Skin is warm and dry.  Neurological:    General: No focal deficit present.     Mental Status: He is alert.  Psychiatric:        Mood and Affect: Mood normal.         UC Treatments / Results  Labs (all labs ordered are listed, but only abnormal results are displayed) Labs Reviewed  CULTURE, BLOOD (ROUTINE X 2)  CULTURE, BLOOD (ROUTINE X 2)  RESP PANEL BY RT-PCR (FLU A&B, COVID) ARPGX2  BASIC METABOLIC PANEL  CBC  LACTIC ACID, PLASMA  LACTIC ACID, PLASMA  BRAIN NATRIURETIC PEPTIDE  TROPONIN I (HIGH SENSITIVITY)      EKG     Radiology  Imaging Results (Last 48 hours)  DG Chest 2 View   Result Date: 06/01/2020 CLINICAL DATA:  Shortness of breath EXAM: CHEST - 2 VIEW COMPARISON:  06/01/2020 prior studies FINDINGS: The cardiomediastinal silhouette is unremarkable. Minimal pulmonary vascular congestion present. There is no evidence of focal airspace disease, pulmonary edema, suspicious pulmonary nodule/mass, pleural effusion, or pneumothorax. No acute bony abnormalities are identified. IMPRESSION: Minimal pulmonary vascular congestion. Electronically Signed   By: Margarette Canada M.D.   On:  06/01/2020 14:59   DG Chest 2 View   Result Date: 06/01/2020 CLINICAL DATA:  Cough and shortness of breath EXAM: CHEST - 2 VIEW COMPARISON:  05/29/20 FINDINGS: The heart size and mediastinal contours are within normal limits. Both lungs are clear. Increased pulmonary vascular congestion without overt edema. No pleural effusion. The visualized skeletal structures are unremarkable. IMPRESSION: Increased pulmonary vascular congestion without overt edema. Electronically Signed   By: Kerby Moors M.D.   On: 06/01/2020 13:12      Procedures Procedures (including critical Hampton time)   Medications Ordered in UC Medications  albuterol (VENTOLIN HFA) 108 (90 Base) MCG/ACT inhaler 6 puff (has no administration in time range)  AeroChamber Plus Flo-Vu Large MISC 1 each (has no administration in time range)  cefTRIAXone (ROCEPHIN) 1 g in sodium chloride 0.9 % 100 mL IVPB (has no administration in time range)  azithromycin (ZITHROMAX) 500 mg in sodium chloride 0.9 % 250 mL IVPB (has no administration in time range)  acetaminophen (TYLENOL) tablet 650 mg (650 mg Oral Given 06/01/20 1509)      Initial Impression / Assessment and Plan / UC Course  I have reviewed the triage vital signs and the nursing notes.   Pertinent labs & imaging results that were available during my Hampton of  the patient were reviewed by me and considered in my medical decision making (see chart for details).     MDM:  Pt extremely short of breath with ambulation.  Chest xray shows some new vascular congestion.  I counseled pt, he needs more evaluation that urgent Hampton can provide.  Pt son is here with him and can transport him to ED.   Final Clinical Impressions(s) / UC Diagnoses    Final diagnoses:  Dyspnea, unspecified type    Discharge Instructions   None          ED Prescriptions         None        PDMP not reviewed this encounter.      Travis Hampton, [place] 06/01/20 1533          Electronically signed by  Travis Meadow, PA-C at 06/01/2020  3:33 PM   Travis Meadow, PA-C 09/09/20 [phone]

## 2021-01-21 NOTE — Progress Notes (Signed)
Primary Physician/Referring:  Reynold Bowen, MD  Patient ID: Travis Hampton, male    DOB: [birth year], 60 y.o.   MRN: [id number]  Chief Complaint  Patient presents with   Hyperlipidemia   Hypertension   HPI:    Travis Hampton  is a 60 y.o. Caucasian male with history of hyperlipidemia and 82-pack-years, quit in 2017.  Patient also has new diagnosis of type 2 diabetes mellitus, diagnosed 05/2020.  Patient denies history of MI, TIA/CVA, DVT/PE, heart failure, family history of premature CAD.  Patient has had no previous cardiac evaluation, with the exception of recent inpatient echocardiograms.  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  Patient now presents for 19-monthfollow-up.  Last office visit had recommended further ischemic risk stratification, however patient had opted to hold off.  He was otherwise stable from a cardiovascular standpoint and no changes were made.  Patient remained stable from a cardiovascular standpoint.  He is asymptomatic and remains relatively active.  Denies chest pain, palpitations, dyspnea, leg edema, orthopnea, PND.  Patient's blood pressure is mildly elevated in the office today, however typically well controlled.  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  Past Medical History:  Diagnosis Date   Arthritis    Diabetes mellitus [place]    High cholesterol    Numbness    left side face due to tree limb accident, after accident October 2016   Shoulder pain, left    left shoulder pain   Torn ligament    left shoulder- found out September 05, 2015   Past Surgical History:  Procedure Laterality Date    EYE SURGERY     limb fell on cherry picker and pt was thrown out/12/2014   LEG SURGERY     due to injury from cherry picker in 01/04/15   TEE WITHOUT CARDIOVERSION N/A 06/06/2020   Procedure: TRANSESOPHAGEAL ECHOCARDIOGRAM (TEE);  Surgeon: RFay Records MD;  [facility]  Service: Cardiovascular;  Laterality: N/A;   Family History  Problem Relation Age of Onset   Colon cancer Cousin    Esophageal cancer Neg Hx    Rectal cancer Neg Hx    Stomach cancer Neg Hx     Social History   Tobacco Use   Smoking status: Former    Packs/day: 0.50    Years: 41.00    Pack years: 20.50    Types: Cigarettes    Quit date: 2017    Years since quitting: 5.8   Smokeless tobacco: Former    Quit date: 09/29/1972   Tobacco comments:    quit July 2017  Substance Use Topics   Alcohol use: No   Marital Status: Married   ROS  Review of Systems  Constitutional: Negative for malaise/fatigue and weight gain.  Cardiovascular:  Negative for chest pain, claudication, leg swelling, near-syncope, orthopnea, palpitations, paroxysmal nocturnal dyspnea and syncope.  Respiratory:  Negative for shortness of breath.   Hematologic/Lymphatic: Does not bruise/bleed easily.  Gastrointestinal:  Negative for melena.  Neurological:  Negative for dizziness and weakness.   Objective  Blood pressure (!) 146/74, pulse 93, temperature 98.2 F (36.8 C), temperature source Temporal, height 6' 2"  (1.88 m), weight 254  lb (115.2 kg), SpO2 96 %.  Vitals with BMI 01/22/2021 07/22/2020 07/10/2020  Height 6' 2"  6' 2"  -  Weight 254 lbs 261 lbs 256 lbs  BMI 32.6 79.0 24.09  Systolic 735 329 924  Diastolic 74 65 79  Pulse 93 74 90  Some encounter information is confidential and restricted. Go to Review Flowsheets activity to see all data.      Physical Exam Vitals reviewed.  Constitutional:      Appearance: He is obese.  Cardiovascular:     Rate and Rhythm: Normal rate and regular rhythm.     Pulses: Intact distal  pulses.     Heart sounds: S1 normal and S2 normal. Murmur heard.  Blowing holosystolic murmur is present with a grade of 2/6 at the apex.    No gallop.  Pulmonary:     Effort: Pulmonary effort is normal. No respiratory distress.     Breath sounds: No wheezing, rhonchi or rales.  Musculoskeletal:     Right lower leg: Edema (trace) present.     Left lower leg: Edema (trace) present.  Neurological:     Mental Status: He is alert.    Laboratory examination:   Recent Labs    06/06/20 0618 06/07/20 0756 06/25/20 1426 06/25/20 1627  NA 141 134* 139 140  K 3.4* 3.7 3.4* 3.9  CL 103 97* 103 105  CO2 32 29 28  --   GLUCOSE 105* 214* 107* 126*  BUN 15 14 6* 7*  CREATININE 0.70 0.72 0.52* 0.40*  CALCIUM 8.1* 8.1* 8.9  --   GFRNONAA >60 >60 >60  --    CrCl cannot be calculated (Patient's most recent lab result is older than the maximum 21 days allowed.).  CMP Latest Ref Rng & Units 06/25/2020 06/25/2020 06/07/2020  Glucose 70 - 99 mg/dL 126(H) 107(H) 214(H)  BUN 8 - 23 mg/dL 7(L) 6(L) 14  Creatinine 0.61 - 1.24 mg/dL 0.40(L) 0.52(L) 0.72  Sodium 135 - 145 mmol/L 140 139 134(L)  Potassium 3.5 - 5.1 mmol/L 3.9 3.4(L) 3.7  Chloride 98 - 111 mmol/L 105 103 97(L)  CO2 22 - 32 mmol/L - 28 29  Calcium 8.9 - 10.3 mg/dL - 8.9 8.1(L)  Total Protein 6.5 - 8.1 g/dL - - -  Total Bilirubin 0.3 - 1.2 mg/dL - - -  Alkaline Phos 38 - 126 U/L - - -  AST 15 - 41 U/L - - -  ALT 0 - 44 U/L - - -   CBC Latest Ref Rng & Units 06/25/2020 06/25/2020 06/06/2020  WBC 4.0 - 10.5 K/uL - 3.9(L) 6.5  Hemoglobin 13.0 - 17.0 g/dL 12.2(L) 12.5(L) 11.4(L)  Hematocrit 39.0 - 52.0 % 36.0(L) 39.3 34.0(L)  Platelets 150 - 400 K/uL - 90(L) 142(L)    Lipid Panel No results for input(s): CHOL, TRIG, LDLCALC, VLDL, HDL, CHOLHDL, LDLDIRECT in the last 8760 hours.  HEMOGLOBIN A1C Lab Results  Component Value Date   HGBA1C 10.9 (H) 06/02/2020   MPG 266.13 06/02/2020   TSH No results for input(s): TSH in the last 8760  hours.  External labs:   06/11/2020: PT/INR 1.2 Total cholesterol 129, triglycerides 110, HDL 28, LDL 79, non-HDL 101 AST 45, ALT 24, alk phos 108  Allergies   Allergies  Allergen Reactions   Testosterone Rash    Other reaction(s): rash and itching    Medications Prior to Visit:   Outpatient Medications Prior to Visit  Medication Sig Dispense Refill   atorvastatin (LIPITOR) 20 MG  tablet Take 20 mg by mouth daily.     Multiple Vitamin (MULTIVITAMIN) tablet Take 1 tablet by mouth daily. Centrum MVI-Take one daily     SYNJARDY XR 12-998 MG TB24 Take 1 tablet by mouth every morning.     blood glucose meter kit and supplies KIT Dispense based on patient and insurance preference. Use up to four times daily as directed. (FOR ICD-9 250.00, 250.01). For QAC - HS accuchecks. 1 each 1   furosemide (LASIX) 20 MG tablet Take 1 tablet (20 mg total) by mouth daily as needed for fluid or edema. 30 tablet 3   ibuprofen (ADVIL) 200 MG tablet Take 400 mg by mouth every 6 (six) hours as needed for mild pain.     insulin aspart (NOVOLOG) 100 UNIT/ML injection Substitute to any brand approved.Before each meal 3 times a day, 140-199 - 2 units, 200-250 - 4 units, 251-299 - 6 units,  300-349 - 8 units,  350 or above 10 units. Dispense syringes and needles as needed, Ok to switch to PEN if approved. DX DM2, Code E11.65 10 mL 0   insulin glargine (LANTUS) 100 UNIT/ML injection Inject 0.15 mLs (15 Units total) into the skin at bedtime. Dispense insulin pen if approved, if not dispense as needed syringes and needles for 1 month supply. Can switch to Levemir. Diagnosis E 11.65. 10 mL 0   insulin lispro (HUMALOG KWIKPEN) 100 UNIT/ML KwikPen See admin instructions.     Insulin Syringe-Needle U-100 25G X 1" 1 ML MISC For 4 times a day insulin SQ, 1 month supply. Diagnosis E11.65 30 each 0   Facility-Administered Medications Prior to Visit  Medication Dose Route Frequency Provider Last Rate Last Admin   0.9 %  sodium  chloride infusion  500 mL Intravenous Continuous Armbruster, Carlota Raspberry, MD       Final Medications at End of Visit    Current Meds  Medication Sig   atorvastatin (LIPITOR) 20 MG tablet Take 20 mg by mouth daily.   Multiple Vitamin (MULTIVITAMIN) tablet Take 1 tablet by mouth daily. Centrum MVI-Take one daily   SYNJARDY XR 12-998 MG TB24 Take 1 tablet by mouth every morning.   Current Facility-Administered Medications for the 01/22/21 encounter (Office Visit) with Alethia Berthold, PA-C  Medication   0.9 %  sodium chloride infusion   Radiology:   No results found.  Cardiac Studies:   PCV ECHOCARDIOGRAM COMPLETE 69/67/8938 Normal LV systolic function with visual EF 60-65%. Left ventricle cavity is normal in size. Normal global wall motion. Normal diastolic filling pattern, normal LAP. Native mitral valve with echogenicity of posterior annulus / leaflet resembling either calcification or healed vegetation. Mild tricuspid regurgitation. No evidence of pulmonary hypertension. Prior TEE 4/1/12022:  Mass on the ventricular surface of the posterior mitral leaflet, composed of multiple echodensities. Measures 8 x 15 mm Tethered in chordae Consistent with vegetation.. Mild mitral valve regurgitation. Small echodensity consistent with Lambl's excrescens; cannot exclude tiny vegetation. Recommendation: No evidence of vegetation may consider TEE if clinically indicated.   Transesophageal echocardiogram 06/06/2020:  1. The left ventricle has normal function. The left ventricle has no regional wall motion abnormalities.   2. Right ventricular systolic function is normal. The right ventricular size is normal.   3. No left atrial/left atrial appendage thrombus was detected.   4. The mitral valve is thickened. There is a mass on the ventricular surface of the posterior mitral leaflet, composed of multiple echodensities. Measures 8 x 15 mm Tethered in chordae  Consistent with vegetation.. Mild mitral  valve regurgitation.   5. AV is mildly thickened, sclerotic. Small echodensity consistent with Lambl's excrescense; cannot exclude tiny vegetation. The aortic valve is tricuspid. Aortic valve regurgitation is mild.   EKG:  01/22/2021: Sinus rhythm at a rate of 82 bpm.  Normal axis.  Incomplete right bundle branch block.  No evidence of ischemia or underlying injury pattern.  EKG 06/23/2020: Sinus rhythm at a rate of 91 bpm.  Left axis, left anterior fascicular block.  Incomplete right bundle branch block.  Poor refreshing, cannot exclude anteroseptal infarct old.  Nonspecific T wave abnormality.  EKG 06/01/2020: Sinus rhythm at a rate of 100 bpm.  Normal axis.  Incomplete right bundle branch block.  Nonspecific ST abnormality, cannot exclude ischemia.  Assessment     ICD-10-CM   1. History of bacterial endocarditis of mital valve   Z86.79     2. Hypercholesterolemia  E78.00 EKG 12-Lead       Medications Discontinued During This Encounter  Medication Reason   insulin glargine (LANTUS) 100 UNIT/ML injection    insulin lispro (HUMALOG KWIKPEN) 100 UNIT/ML KwikPen    Insulin Syringe-Needle U-100 25G X 1" 1 ML MISC    ibuprofen (ADVIL) 200 MG tablet    insulin aspart (NOVOLOG) 100 UNIT/ML injection    furosemide (LASIX) 20 MG tablet     No orders of the defined types were placed in this encounter.   Recommendations:   Travis Hampton is a 60 y.o. Caucasian male with history of hyperlipidemia and 82-pack-years, quit in 2017.  Patient also has new diagnosis of type 2 diabetes mellitus, diagnosed 05/2020.  Patient denies history of MI, TIA/CVA, DVT/PE, heart failure, family history of premature CAD.  Patient has had no previous cardiac evaluation, with the exception of recent inpatient echocardiograms.  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  Patient now presents for 69-monthfollow-up.  Last office visit had recommended further ischemic risk stratification, however patient had opted to hold off.  He was otherwise stable from a cardiovascular standpoint and no changes were made.  Patient remains stable and is relatively active.  He is presently asymptomatic.  Again discussed recommendation regarding additional ischemic evaluation for further cardiac risk stratification, however patient continues to wish to hold off on further cardiac investigation at this time.  Patient has upcoming physical with PCP for lab work including lipid profile testing.  Recommend LDL <70.  Although patient's blood pressure is mildly elevated in the office today will not make changes given that this is an isolated reading.  Will defer further management to PCP at upcoming follow-up appointment.  In view of mitral valve endocarditis, discussed with patient indication for antibiotic prophylaxis prior to dental procedures, colonoscopies, etc.  Patient verbalized understanding and agreement.  I have therefore prescribed amoxicillin 500 mg tablets, and instructed patient to take 2000 mg (4 tablets) once 30 to 60 minutes prior to any procedures.    Follow-up in 1 year, sooner if needed, for hyperlipidemia, endocarditis, and cardiovascular risk management.    CAlethia Berthold PA-C 01/22/2021, 9:44 AM Office: [phone]

## 2021-01-22 ENCOUNTER — Ambulatory Visit: Payer: 59 | Admitting: Student

## 2021-01-22 ENCOUNTER — Other Ambulatory Visit: Payer: Self-pay

## 2021-01-22 ENCOUNTER — Encounter: Payer: Self-pay | Admitting: Student

## 2021-01-22 VITALS — BP 146/74 | HR 93 | Temp 98.2°F | Ht 74.0 in | Wt 254.0 lb

## 2021-01-22 DIAGNOSIS — E7800 Pure hypercholesterolemia, unspecified: Secondary | ICD-10-CM

## 2021-01-22 DIAGNOSIS — Z8679 Personal history of other diseases of the circulatory system: Secondary | ICD-10-CM

## 2021-04-17 ENCOUNTER — Other Ambulatory Visit: Payer: Self-pay | Admitting: Endocrinology

## 2021-04-17 DIAGNOSIS — K7460 Unspecified cirrhosis of liver: Secondary | ICD-10-CM

## 2021-04-28 ENCOUNTER — Ambulatory Visit
Admission: RE | Admit: 2021-04-28 | Discharge: 2021-04-28 | Disposition: A | Discharge status: Home / Self Care | Payer: 59 | Source: Ambulatory Visit | Attending: Endocrinology | Admitting: Endocrinology

## 2021-04-28 ENCOUNTER — Other Ambulatory Visit: Payer: Self-pay

## 2021-04-28 DIAGNOSIS — K7460 Unspecified cirrhosis of liver: Secondary | ICD-10-CM

## 2021-07-24 ENCOUNTER — Encounter: Payer: Self-pay | Admitting: Gastroenterology

## 2021-08-25 ENCOUNTER — Ambulatory Visit: Payer: 59 | Admitting: Gastroenterology

## 2021-08-25 ENCOUNTER — Encounter: Payer: Self-pay | Admitting: Gastroenterology

## 2021-08-25 ENCOUNTER — Other Ambulatory Visit: Payer: 59

## 2021-08-25 DIAGNOSIS — K7460 Unspecified cirrhosis of liver: Secondary | ICD-10-CM | POA: Diagnosis not present

## 2021-08-25 DIAGNOSIS — R7989 Other specified abnormal findings of blood chemistry: Secondary | ICD-10-CM | POA: Diagnosis not present

## 2021-08-25 DIAGNOSIS — Z86010 Personal history of colonic polyps: Secondary | ICD-10-CM | POA: Diagnosis not present

## 2021-08-25 DIAGNOSIS — Z23 Encounter for immunization: Secondary | ICD-10-CM | POA: Diagnosis not present

## 2021-08-25 NOTE — Patient Instructions (Addendum)
If you are age 63 or younger, your body mass index should be between 19-25. Your Body mass index is 35.48 kg/m. If this is out of the aformentioned range listed, please consider follow up with your Primary Care Provider.  ________________________________________________________  The [place] [facility] providers would like to encourage you to use [facility] to communicate with providers for non-urgent requests or questions.  Due to long hold times on the telephone, sending your provider a message by [facility] may be a faster and more efficient way to get a response.  Please allow 48 business hours for a response.  Please remember that this is for non-urgent requests.  _______________________________________________________  Your provider has requested that you go to the basement level for lab work before leaving today. Press "B" on the elevator. The lab is located at the first door on the left as you exit the elevator.  You have been scheduled for an endoscopy. Please follow written instructions given to you at your visit today. If you use inhalers (even only as needed), please bring them with you on the day of your procedure.  Due to recent changes in healthcare laws, you may see the results of your imaging and laboratory studies on MyChart before your provider has had a chance to review them.  We understand that in some cases there may be results that are confusing or concerning to you. Not all laboratory results come back in the same time frame and the provider may be waiting for multiple results in order to interpret others.  Please give [place] 48 hours in order for your provider to thoroughly review all the results before contacting the office for clarification of your results.   You will need an ultrasound of your liver in August 2023.  We will contact you to schedule this appointment.  You were given Hepatis A and Hepatis vaccines today.  Thank you for entrusting me with your care and choosing [facility].  Dr Havery Moros

## 2021-08-25 NOTE — Progress Notes (Signed)
HPI :  61 y/o male with history of cirrhosis, colon polyps, DVT, diabetes, here to reestablish care, referred by Roosevelt Locks NP for cirrhosis and evaluation for varices screening.  He comes in with newly diagnosed cirrhosis.  It appears back in 2022 he had a CT scan of his chest done to rule out a PE, this showed incidentally cirrhotic morphology of his liver and splenomegaly.  Actually dating back to 2016 and a CT scan abdomen pelvis there is suggestive changes of cirrhosis of the liver.  Patient states he was not aware of this.  He had a follow-up ultrasound with elastography in February of this year showing nodular contour of the liver with median stiffness score of 42.5 kPA.  Over time he has had some elevations in his liver enzymes, AST greater than ALT.most recently in April he had an alk phos of 145, AST 40, ALT 43, T. bili 0.8. He is also had thrombocytopenia to with value of 107, hemoglobin of 14.3, INR 1.1. He had a work-up with the hepatology clinic which showed an iron saturation of 56% and ferritin of 1062.  He was told to come back for genetic testing for hemochromatosis but has not done so yet.  He also had a positive ANA to 1-3 20 and an elevated IgG of 1736, with normal SMA.  Rest of his labs showed he is not immune to hepatitis A or B, was told to come in for vaccination for that but he has not done so yet.  His lab work-up was otherwise unremarkable for other causes.  He does have a history of significant alcohol use in the past, previously drank 1 pint of hard liquor per week along with occasional beers.  His last drink was in 2012.  He had a DUI in 2010.  His wife is ill and he cares for her, he states he cannot afford to be drinking alcohol anymore.  His wife has aphasia with memory loss and he has a full-time care provider.  He has not had any decompensating events that we are aware of.  No ascites, no history of GI bleeding, no jaundice, no hepatic encephalopathy.  He denies any  problems of reflux or dysphagia.  He denies any blood in his stools.  States he has not regular bowel habits.  No abdominal pains he complains of.  He does have a second-degree relative with a history of reported cirrhosis.  He has not had a prior EGD.  He had a colonoscopy with me in 2017 with a few polyps removed.  I recommended a repeat exam in 2020.  He states he does not want colonoscopy will not have that done until he is age 86.  Prior workup: Iron sat 56% Ferritin 1062 ANA 1:320 IgG 1736 SMA 8  CT scan of the chest done to rule out PE showed cirrhotic morphology of the liver and splenomegaly in March 2022  [place] with elastography Feb 2023 - median stiffness score of 42.5 kPa and nodular contour of the liver  Colonoscopy 10/14/2015: One 4 mm polyp in the cecum, removed with a cold snare. Resected and retrieved. - Three 4 to 7 mm polyps in the descending colon, removed with a cold snare. Resected and retrieved. - Restricted mobility of the left colon, the exam required the use of a pediatric colonoscope to traverse this area. - The examination was otherwise normal.  Surgical [P], cecum x 1, descending x 3, polyp (4 total) -TUBULAR ADENOMAS AND POLYPOID BENIGN  COLONIC MUCOSA. -NO HIGH GRADE DYSPLASIA OR MALIGNANCY IDENTIFIED.   Past Medical History:  Diagnosis Date   Arthritis    Benign neoplasm of colon    Cholelithiasis without obstruction    Cirrhosis ([place])    COVID-19    Diabetes mellitus without complication ([place])    DVT (deep venous thrombosis) ([place])    left lower extremity   Endocarditis of mitral valve    Hardening of the aorta (main artery of the heart) (HCC)    High cholesterol    History of alcohol abuse    Hyperlipemia    Numbness    left side face due to tree limb accident, after accident October 2016   Rheumatic mitral valve disease    Shoulder pain, left    left shoulder pain   Torn ligament    left shoulder- found out September 05, 2015     Past  Surgical History:  Procedure Laterality Date   EYE SURGERY     limb fell on cherry picker and pt was thrown out/12/2014   HAND SURGERY     left   LEG SURGERY     due to injury from cherry picker in 01/04/15   TEE WITHOUT CARDIOVERSION N/A 06/06/2020   Procedure: TRANSESOPHAGEAL ECHOCARDIOGRAM (TEE);  Surgeon: Fay Records, MD;  Location: [facility] ENDOSCOPY;  Service: Cardiovascular;  Laterality: N/A;   Family History  Problem Relation Age of Onset   Rheum arthritis Mother    Leukemia Father        chronic myeloid leukemia   Cirrhosis Maternal Aunt    Diabetes Maternal Uncle    Cirrhosis Maternal Uncle    Leukemia Paternal Uncle    Lung cancer Paternal Uncle    Cancer Paternal Grandmother    Diabetes Paternal Grandmother    Leukemia Paternal Grandfather        chronic myeloid leukemia   Colon cancer Cousin    Esophageal cancer Neg Hx    Rectal cancer Neg Hx    Stomach cancer Neg Hx    Social History   Tobacco Use   Smoking status: Former    Packs/day: 0.50    Years: 41.00    Total pack years: 20.50    Types: Cigarettes    Quit date: 2017    Years since quitting: 6.4   Smokeless tobacco: Former    Quit date: 09/29/1972   Tobacco comments:    quit July 2017  Vaping Use   Vaping Use: Never used  Substance Use Topics   Alcohol use: No    Comment: last drink in 2012   Drug use: No   Current Outpatient Medications  Medication Sig Dispense Refill   atorvastatin (LIPITOR) 20 MG tablet Take 20 mg by mouth daily.     blood glucose meter kit and supplies KIT Dispense based on patient and insurance preference. Use up to four times daily as directed. (FOR ICD-9 250.00, 250.01). For QAC - HS accuchecks. 1 each 1   Multiple Vitamin (MULTIVITAMIN) tablet Take 1 tablet by mouth daily. Centrum MVI-Take one daily     SYNJARDY XR 12-998 MG TB24 Take 1 tablet by mouth every morning.     Current Facility-Administered Medications  Medication Dose Route Frequency Provider Last Rate Last  Admin   0.9 %  sodium chloride infusion  500 mL Intravenous Continuous Adrean Heitz, Carlota Raspberry, MD       Allergies  Allergen Reactions   Testosterone Rash    Other reaction(s): rash and itching  Review of Systems: All systems reviewed and negative except where noted in HPI.   Lab Results  Component Value Date   WBC 3.9 (L) 06/25/2020   HGB 12.2 (L) 06/25/2020   HCT 36.0 (L) 06/25/2020   MCV 105.4 (H) 06/25/2020   PLT 90 (L) 06/25/2020   Labs reviewed in care everywhere  Physical Exam: BP 124/70   Pulse 82   Ht 6' 2"  (1.88 m)   Wt 276 lb 6 oz (125.4 kg)   BMI 35.48 kg/m  Constitutional: Pleasant,well-developed, male in no acute distress. HEENT: Normocephalic and atraumatic. Conjunctivae are normal. No scleral icterus. Neck supple.  Cardiovascular: Normal rate, regular rhythm.  Pulmonary/chest: Effort normal and breath sounds normal. No wheezing, rales or rhonchi. Abdominal: Soft, nondistended but protuberant, nontender. There are no masses palpable. No hepatomegaly. Extremities: no edema Lymphadenopathy: No cervical adenopathy noted. Neurological: Alert and oriented to person place and time. Skin: Skin is warm and dry. No rashes noted. Psychiatric: Normal mood and affect. Behavior is normal.   ASSESSMENT AND PLAN: 61 year old male here to reestablish care for the following:  Cirrhosis Elevated ferritin, iron saturation History of colon polyps  As above, patient with imaging evidence of cirrhosis dating back several years at this point however only recently sought care for this or was made aware of it.  He has had compensated cirrhosis.  History of significant alcohol abuse in the past but has been sober now for more than 10 years.  Lab work-up is remarkable for an elevated iron saturation and ferritin.  He warrants lab testing for hemochromatosis genetics.  We discussed what this is.  He has not followed up with that with hepatology office, I offered to do that for  him today, he was agreeable to that.  We discussed cirrhosis in general, risks of decompensating events moving forward and risks for [place].  He is understanding of this.  He is not immune to hepatitis A and B and recommended the hepatitis A and B vaccine today, he was agreeable to that.  Recommend an EGD to screen him for esophageal varices.  I discussed risk benefits of endoscopy and anesthesia with him and he wants to proceed.  At the same time I am recommending he perform his surveillance colonoscopy as he is overdue for that for his history of colon polyps.  After discussion of colonoscopy he is really not interested in pursuing that right now.  He states he will do it at age 51.  I counseled him that he is already overdue for his colonoscopy and at risk for colon cancer with history of polyps, he understands this and again declines the exam.  He will be due for [facility] screening with another ultrasound later this summer.  All questions answered he agrees with the plan, he will continue to follow-up with hepatology and myself at least once yearly.  He will avoid NSAIDs and abstain from alcohol.  Plan: - schedule EGD in the [facility] - lab for hemochromatosis genetic testing   - Hepatitis A and B vaccine administered today - recommended surveillance colonoscopy for history of polyps - he declines - due for [facility] screening in August  Jolly Mango, MD [facility]  CC: Roosevelt Locks, [place]

## 2021-08-30 ENCOUNTER — Encounter: Payer: Self-pay | Admitting: Certified Registered Nurse Anesthetist

## 2021-09-01 ENCOUNTER — Encounter: Payer: Self-pay | Admitting: Certified Registered Nurse Anesthetist

## 2021-09-02 ENCOUNTER — Encounter: Payer: Self-pay | Admitting: Gastroenterology

## 2021-09-02 ENCOUNTER — Ambulatory Visit (AMBULATORY_SURGERY_CENTER): Payer: 59 | Admitting: Gastroenterology

## 2021-09-02 VITALS — BP 107/66 | HR 64 | Temp 98.6°F | Resp 17 | Ht 74.0 in | Wt 276.0 lb

## 2021-09-02 DIAGNOSIS — K2980 Duodenitis without bleeding: Secondary | ICD-10-CM

## 2021-09-02 DIAGNOSIS — K7460 Unspecified cirrhosis of liver: Secondary | ICD-10-CM | POA: Diagnosis present

## 2021-09-02 DIAGNOSIS — K31A0 Gastric intestinal metaplasia, unspecified: Secondary | ICD-10-CM | POA: Diagnosis not present

## 2021-09-02 DIAGNOSIS — K317 Polyp of stomach and duodenum: Secondary | ICD-10-CM | POA: Diagnosis not present

## 2021-09-02 DIAGNOSIS — K2950 Unspecified chronic gastritis without bleeding: Secondary | ICD-10-CM | POA: Diagnosis not present

## 2021-09-02 DIAGNOSIS — K3189 Other diseases of stomach and duodenum: Secondary | ICD-10-CM | POA: Diagnosis not present

## 2021-09-02 MED ORDER — SODIUM CHLORIDE 0.9 % IV SOLN: 500.0000 mL | Freq: Once | INTRAVENOUS | Status: DC

## 2021-09-02 NOTE — Progress Notes (Signed)
History and Physical Interval Note: Seen on 08/25/21 in the office - no interval changes. Newly diagnosed cirrhosis, EGD to screen for esophageal varices. I have discussed risks / benefits and he wants to proceed. Further recommendations pending the results.  09/02/2021 2:53 PM  Travis Hampton  has presented today for endoscopic procedure(s), with the diagnosis of  Encounter Diagnosis  Name Primary?   Cirrhosis of liver without ascites, unspecified hepatic cirrhosis type ([place]) Yes  .  The various methods of evaluation and treatment have been discussed with the patient and/or family. After consideration of risks, benefits and other options for treatment, the patient has consented to  the endoscopic procedure(s).   The patient's history has been reviewed, patient examined, no change in status, stable for surgery.  I have reviewed the patient's chart and labs.  Questions were answered to the patient's satisfaction.    Jolly Mango, MD [facility]

## 2021-09-02 NOTE — Op Note (Signed)
[place] Patient Name: Travis Hampton Procedure Date: 09/02/2021 2:53 PM MRN: [id number] Endoscopist: Remo Lipps P. Havery Moros , MD Age: 61 Referring MD:  Date of Birth: 02-23-1961 Gender: Male Account #: [id number] Procedure:                Upper GI endoscopy Indications:              Cirrhosis rule out esophageal varices Medicines:                Monitored Anesthesia Care Procedure:                Pre-Anesthesia Assessment:                           - Prior to the procedure, a History and Physical                            was performed, and patient medications and                            allergies were reviewed. The patient's tolerance of                            previous anesthesia was also reviewed. The risks                            and benefits of the procedure and the sedation                            options and risks were discussed with the patient.                            All questions were answered, and informed consent                            was obtained. Prior Anticoagulants: The patient has                            taken no previous anticoagulant or antiplatelet                            agents. ASA Grade Assessment: III - A patient with                            severe systemic disease. After reviewing the risks                            and benefits, the patient was deemed in                            satisfactory condition to undergo the procedure.                           After obtaining informed consent, the endoscope was  passed under direct vision. Throughout the                            procedure, the patient's blood pressure, pulse, and                            oxygen saturations were monitored continuously. The                            GIF [id number] was introduced through the                            mouth, and advanced to the second part of duodenum.                            The upper  GI endoscopy was accomplished without                            difficulty. The patient tolerated the procedure                            well. Scope In: Scope Out: Findings:                 Esophagogastric landmarks were identified: the                            Z-line was found at 43 cm, the gastroesophageal                            junction was found at 43 cm and the upper extent of                            the gastric folds was found at 43 cm from the                            incisors.                           The exam of the esophagus was otherwise normal. No                            esophageal varices.                           A few small benign appearing sessile polyps were                            found in the gastric antrum. Biopsies were taken                            with a cold forceps for histology.                           The exam of the stomach  was otherwise normal. No                            gastric varices.                           Diffuse mild inflammation characterized by                            erosions, erythema and granularity was found in the                            duodenal bulb entering the sweep. Biopsies were                            taken with a cold forceps for histology.                           The exam of the duodenum was otherwise normal. Complications:            No immediate complications. Estimated blood loss:                            Minimal. Estimated Blood Loss:     Estimated blood loss was minimal. Impression:               - Esophagogastric landmarks identified.                           - Normal esophagus - no varices                           - A few gastric polyps. Biopsied.                           - Normal stomach otherwise - no varices                           - Mild duodenitis as described. Biopsied.                           - Normal duodenum otherwise Recommendation:           - Patient has a contact  number available for                            emergencies. The signs and symptoms of potential                            delayed complications were discussed with the                            patient. Return to normal activities tomorrow.                            Written discharge instructions were provided to the  patient.                           - Resume previous diet.                           - Continue present medications.                           - Await pathology results.                           - Repeat EGD in 2-3 years for varices screening Travis Hampton P. Havery Moros, MD 09/02/2021 3:08:27 PM This report has been signed electronically.

## 2021-09-02 NOTE — Progress Notes (Signed)
Called to room to assist during endoscopic procedure.  Patient ID and intended procedure confirmed with present staff. Received instructions for my participation in the procedure from the performing physician.  

## 2021-09-02 NOTE — Patient Instructions (Signed)
Handout given on polyps.  Resume previous diet.  Continue current medications.  Await pathology results.  Repeat EDG in 2-3 years for varices screening.  YOU HAD AN ENDOSCOPIC PROCEDURE TODAY AT [facility]:   Refer to the procedure report that was given to you for any specific questions about what was found during the examination.  If the procedure report does not answer your questions, please call your gastroenterologist to clarify.  If you requested that your care partner not be given the details of your procedure findings, then the procedure report has been included in a sealed envelope for you to review at your convenience later.  YOU SHOULD EXPECT: Some feelings of bloating in the abdomen. Passage of more gas than usual.  Walking can help get rid of the air that was put into your GI tract during the procedure and reduce the bloating. If you had a lower endoscopy (such as a colonoscopy or flexible sigmoidoscopy) you may notice spotting of blood in your stool or on the toilet paper. If you underwent a bowel prep for your procedure, you may not have a normal bowel movement for a few days.  Please Note:  You might notice some irritation and congestion in your nose or some drainage.  This is from the oxygen used during your procedure.  There is no need for concern and it should clear up in a day or so.  SYMPTOMS TO REPORT IMMEDIATELY:   Following upper endoscopy (EGD)  Vomiting of blood or coffee ground material  New chest pain or pain under the shoulder blades  Painful or persistently difficult swallowing  New shortness of breath  Fever of 100F or higher  Black, tarry-looking stools  For urgent or emergent issues, a gastroenterologist can be reached at any hour by calling [phone]. Do not use MyChart messaging for urgent concerns.    DIET:  We do recommend a small meal at first, but then you may proceed to your regular diet.  Drink plenty of fluids but you should  avoid alcoholic beverages for 24 hours.  ACTIVITY:  You should plan to take it easy for the rest of today and you should NOT DRIVE or use heavy machinery until tomorrow (because of the sedation medicines used during the test).    FOLLOW UP: Our staff will call the number listed on your records the next business day following your procedure.  We will call around 7:15- 8:00 am to check on you and address any questions or concerns that you may have regarding the information given to you following your procedure. If we do not reach you, we will leave a message.  If you develop any symptoms (ie: fever, flu-like symptoms, shortness of breath, cough etc.) before then, please call [phone].  If you test positive for Covid 19 in the 2 weeks post procedure, please call and report this information to [place].    If any biopsies were taken you will be contacted by phone or by letter within the next 1-3 weeks.  Please call us at [phone] if you have not heard about the biopsies in 3 weeks.    SIGNATURES/CONFIDENTIALITY: You and/or your care partner have signed paperwork which will be entered into your electronic medical record.  These signatures attest to the fact that that the information above on your After Visit Summary has been reviewed and is understood.  Full responsibility of the confidentiality of this discharge information lies with you and/or your care-partner.

## 2021-09-02 NOTE — Progress Notes (Signed)
Report given to PACU, vss 

## 2021-09-03 ENCOUNTER — Telehealth: Payer: Self-pay

## 2021-09-03 LAB — HEMOCHROMATOSIS DNA-PCR(C282Y,H63D)

## 2021-09-03 NOTE — Telephone Encounter (Signed)
  Follow up Call-     09/02/2021    2:12 PM  Call back number  Post procedure Call Back phone  # [phone]  Permission to leave phone message Yes     Patient questions:  Do you have a fever, pain , or abdominal swelling? No. Pain Score  0 *  Have you tolerated food without any problems? Yes.    Have you been able to return to your normal activities? Yes.    Do you have any questions about your discharge instructions: Diet   No. Medications  No. Follow up visit  No.  Do you have questions or concerns about your Care? No.  Actions: * If pain score is 4 or above: No action needed, pain <4.

## 2021-09-09 ENCOUNTER — Encounter: Payer: Self-pay | Admitting: Gastroenterology

## 2021-09-25 ENCOUNTER — Ambulatory Visit (INDEPENDENT_AMBULATORY_CARE_PROVIDER_SITE_OTHER): Payer: 59 | Admitting: Gastroenterology

## 2021-09-25 DIAGNOSIS — Z23 Encounter for immunization: Secondary | ICD-10-CM

## 2021-09-25 DIAGNOSIS — K7460 Unspecified cirrhosis of liver: Secondary | ICD-10-CM | POA: Diagnosis not present

## 2021-10-14 ENCOUNTER — Other Ambulatory Visit: Payer: Self-pay

## 2021-10-14 ENCOUNTER — Telehealth: Payer: Self-pay

## 2021-10-14 DIAGNOSIS — K7460 Unspecified cirrhosis of liver: Secondary | ICD-10-CM

## 2021-10-14 NOTE — Telephone Encounter (Signed)
Patient aware that he has been scheduled for a RUQ ultrasound on 10-19-21 at 9:30am at [facility].  Patient advised to arrive at 9:00am.  Patient advised NPO after midnight.    Patient agreed to plan and verbalized understanding.  No further questions.

## 2021-10-19 ENCOUNTER — Ambulatory Visit (HOSPITAL_COMMUNITY)
Admission: RE | Admit: 2021-10-19 | Discharge: 2021-10-19 | Disposition: A | Discharge status: Home / Self Care | Payer: 59 | Source: Ambulatory Visit | Attending: Gastroenterology | Admitting: Gastroenterology

## 2021-10-19 DIAGNOSIS — K7460 Unspecified cirrhosis of liver: Secondary | ICD-10-CM | POA: Insufficient documentation

## 2021-10-20 ENCOUNTER — Other Ambulatory Visit: Payer: Self-pay

## 2021-10-20 DIAGNOSIS — K7460 Unspecified cirrhosis of liver: Secondary | ICD-10-CM

## 2021-12-21 ENCOUNTER — Telehealth: Payer: Self-pay

## 2021-12-21 NOTE — Telephone Encounter (Signed)
Spoke with patient to remind him that he is due for repeat labs at this time. No appointment is necessary. Patient is aware that he can stop by the lab in the basement at his convenience between 7:30 AM - 5 PM, Monday through Friday. Patient verbalized understanding and had no concerns at the end of the call.   

## 2021-12-21 NOTE — Telephone Encounter (Signed)
-----  Message from Yevette Edwards, RN sent at 10/20/2021  2:31 PM EDT ----- Regarding: Labs CBC, C-MET, INR, AFP - orders are in epic

## 2022-01-22 ENCOUNTER — Ambulatory Visit: Payer: 59 | Admitting: Student

## 2022-02-25 ENCOUNTER — Other Ambulatory Visit: Payer: Self-pay

## 2022-02-25 ENCOUNTER — Ambulatory Visit (INDEPENDENT_AMBULATORY_CARE_PROVIDER_SITE_OTHER): Payer: 59 | Admitting: Gastroenterology

## 2022-02-25 DIAGNOSIS — K7460 Unspecified cirrhosis of liver: Secondary | ICD-10-CM

## 2022-02-25 DIAGNOSIS — Z23 Encounter for immunization: Secondary | ICD-10-CM | POA: Diagnosis not present

## 2022-03-17 ENCOUNTER — Encounter: Payer: Self-pay | Admitting: Gastroenterology

## 2022-04-22 ENCOUNTER — Telehealth: Payer: Self-pay

## 2022-04-22 DIAGNOSIS — K7460 Unspecified cirrhosis of liver: Secondary | ICD-10-CM

## 2022-04-22 NOTE — Telephone Encounter (Signed)
-----   Message from Yevette Edwards, RN sent at 10/20/2021  2:31 PM EDT ----- Regarding: RUQ [place] RUQ [place] - [place] screening, Cirrhosis   need to enter order

## 2022-04-22 NOTE — Telephone Encounter (Signed)
Lm on vm for patient to return call.  RUQ [place] order in epic.

## 2022-04-26 NOTE — Telephone Encounter (Signed)
Lm on vm for patient to return call 

## 2022-04-29 NOTE — Telephone Encounter (Signed)
Pt returned call. I informed him that he is due for repeat RUQ [place] at this time for cirrhosis. Pt states he "is not doing any further imaging, he will see [place] for a colonoscopy when he turns 83". I informed patient that this is for his cirrhosis, he states that he "still isn't going to do it". I discontinued RUQ [place] order.

## 2022-04-29 NOTE — Telephone Encounter (Signed)
3rd attempt to reach patient. Lm on vm for patient to return call.

## 2023-12-05 ENCOUNTER — Encounter: Payer: Self-pay | Admitting: Gastroenterology
